# Patient Record
Sex: Male | Born: 1970 | Race: Black or African American | Hispanic: No | Marital: Married | Smoking: Former smoker
Health system: Southern US, Community
[De-identification: ages and names within clinical notes are randomized; demographics above are authoritative.]

## PROBLEM LIST (undated history)

## (undated) DIAGNOSIS — A4902 Methicillin resistant Staphylococcus aureus infection, unspecified site: Secondary | ICD-10-CM

## (undated) DIAGNOSIS — I10 Essential (primary) hypertension: Secondary | ICD-10-CM

## (undated) HISTORY — DX: Morbid (severe) obesity due to excess calories: E66.01

## (undated) HISTORY — DX: Methicillin resistant Staphylococcus aureus infection, unspecified site: A49.02

## (undated) HISTORY — DX: Essential (primary) hypertension: I10

---

## 1998-07-15 ENCOUNTER — Encounter: Payer: Self-pay | Admitting: Emergency Medicine

## 1998-07-15 ENCOUNTER — Emergency Department (HOSPITAL_COMMUNITY): Admission: EM | Admit: 1998-07-15 | Discharge: 1998-07-15 | Payer: Self-pay | Admitting: Emergency Medicine

## 2001-05-14 ENCOUNTER — Encounter: Payer: Self-pay | Admitting: Emergency Medicine

## 2001-05-14 ENCOUNTER — Emergency Department (HOSPITAL_COMMUNITY): Admission: EM | Admit: 2001-05-14 | Discharge: 2001-05-14 | Payer: Self-pay | Admitting: Emergency Medicine

## 2007-10-08 ENCOUNTER — Emergency Department (HOSPITAL_COMMUNITY): Admission: AC | Admit: 2007-10-08 | Discharge: 2007-10-09 | Payer: Self-pay

## 2007-10-11 ENCOUNTER — Emergency Department (HOSPITAL_COMMUNITY): Admission: EM | Admit: 2007-10-11 | Discharge: 2007-10-11 | Payer: Self-pay | Admitting: Emergency Medicine

## 2009-07-17 ENCOUNTER — Emergency Department (HOSPITAL_COMMUNITY): Admission: EM | Admit: 2009-07-17 | Discharge: 2009-07-17 | Payer: Self-pay | Admitting: Emergency Medicine

## 2009-07-20 ENCOUNTER — Ambulatory Visit: Payer: Self-pay | Admitting: Emergency Medicine

## 2009-07-20 ENCOUNTER — Inpatient Hospital Stay (HOSPITAL_COMMUNITY): Admission: EM | Admit: 2009-07-20 | Discharge: 2009-08-06 | Payer: Self-pay | Admitting: Emergency Medicine

## 2009-07-21 ENCOUNTER — Encounter (INDEPENDENT_AMBULATORY_CARE_PROVIDER_SITE_OTHER): Payer: Self-pay | Admitting: Pulmonary Disease

## 2009-07-22 ENCOUNTER — Encounter: Payer: Self-pay | Admitting: Internal Medicine

## 2009-07-22 ENCOUNTER — Encounter (INDEPENDENT_AMBULATORY_CARE_PROVIDER_SITE_OTHER): Payer: Self-pay | Admitting: Internal Medicine

## 2009-07-23 ENCOUNTER — Encounter: Payer: Self-pay | Admitting: Emergency Medicine

## 2009-07-23 ENCOUNTER — Ambulatory Visit: Payer: Self-pay | Admitting: Vascular Surgery

## 2009-07-24 ENCOUNTER — Encounter: Payer: Self-pay | Admitting: Family Medicine

## 2009-08-23 ENCOUNTER — Ambulatory Visit: Payer: Self-pay | Admitting: Vascular Surgery

## 2009-08-23 ENCOUNTER — Ambulatory Visit: Admission: RE | Admit: 2009-08-23 | Discharge: 2009-08-23 | Payer: Self-pay | Admitting: Internal Medicine

## 2009-08-28 ENCOUNTER — Encounter (HOSPITAL_BASED_OUTPATIENT_CLINIC_OR_DEPARTMENT_OTHER): Admission: RE | Admit: 2009-08-28 | Discharge: 2009-10-09 | Payer: Self-pay | Admitting: Internal Medicine

## 2010-03-17 ENCOUNTER — Encounter: Payer: Self-pay | Admitting: Internal Medicine

## 2010-05-13 LAB — BLOOD GAS, ARTERIAL
Acid-base deficit: 7.8 mmol/L — ABNORMAL HIGH (ref 0.0–2.0)
FIO2: 100 %
O2 Saturation: 97.9 %
Patient temperature: 99.2
pO2, Arterial: 129 mmHg — ABNORMAL HIGH (ref 80.0–100.0)

## 2010-05-13 LAB — RENAL FUNCTION PANEL
Albumin: 1.4 g/dL — ABNORMAL LOW (ref 3.5–5.2)
Albumin: 1.6 g/dL — ABNORMAL LOW (ref 3.5–5.2)
Albumin: 1.7 g/dL — ABNORMAL LOW (ref 3.5–5.2)
Albumin: 1.9 g/dL — ABNORMAL LOW (ref 3.5–5.2)
Albumin: 1.6 g/dL — ABNORMAL LOW (ref 3.5–5.2)
BUN: 49 mg/dL — ABNORMAL HIGH (ref 6–23)
BUN: 57 mg/dL — ABNORMAL HIGH (ref 6–23)
BUN: 76 mg/dL — ABNORMAL HIGH (ref 6–23)
BUN: 80 mg/dL — ABNORMAL HIGH (ref 6–23)
BUN: 93 mg/dL — ABNORMAL HIGH (ref 6–23)
BUN: 84 mg/dL — ABNORMAL HIGH (ref 6–23)
CO2: 20 mEq/L (ref 19–32)
CO2: 21 mEq/L (ref 19–32)
CO2: 24 mEq/L (ref 19–32)
CO2: 28 mEq/L (ref 19–32)
CO2: 36 mEq/L — ABNORMAL HIGH (ref 19–32)
CO2: 23 mEq/L (ref 19–32)
CO2: 24 mEq/L (ref 19–32)
Calcium: 7.1 mg/dL — ABNORMAL LOW (ref 8.4–10.5)
Calcium: 8.1 mg/dL — ABNORMAL LOW (ref 8.4–10.5)
Calcium: 8.5 mg/dL (ref 8.4–10.5)
Calcium: 8.6 mg/dL (ref 8.4–10.5)
Calcium: 8.8 mg/dL (ref 8.4–10.5)
Calcium: 9 mg/dL (ref 8.4–10.5)
Calcium: 6.5 mg/dL — ABNORMAL LOW (ref 8.4–10.5)
Chloride: 102 mEq/L (ref 96–112)
Chloride: 103 mEq/L (ref 96–112)
Chloride: 104 mEq/L (ref 96–112)
Chloride: 108 mEq/L (ref 96–112)
Chloride: 117 mEq/L — ABNORMAL HIGH (ref 96–112)
Chloride: 103 mEq/L (ref 96–112)
Chloride: 104 mEq/L (ref 96–112)
Chloride: 104 mEq/L (ref 96–112)
Creatinine, Ser: 4.41 mg/dL — ABNORMAL HIGH (ref 0.4–1.5)
Creatinine, Ser: 4.57 mg/dL — ABNORMAL HIGH (ref 0.4–1.5)
Creatinine, Ser: 4.78 mg/dL — ABNORMAL HIGH (ref 0.4–1.5)
Creatinine, Ser: 5.9 mg/dL — ABNORMAL HIGH (ref 0.4–1.5)
Creatinine, Ser: 6.07 mg/dL — ABNORMAL HIGH (ref 0.4–1.5)
GFR calc Af Amer: 13 mL/min — ABNORMAL LOW (ref >60)
GFR calc Af Amer: 16 mL/min — ABNORMAL LOW (ref >60)
GFR calc Af Amer: 17 mL/min — ABNORMAL LOW (ref >60)
GFR calc Af Amer: 25 mL/min — ABNORMAL LOW (ref >60)
GFR calc Af Amer: 9 mL/min — ABNORMAL LOW (ref >60)
GFR calc non Af Amer: 11 mL/min — ABNORMAL LOW (ref >60)
GFR calc non Af Amer: 11 mL/min — ABNORMAL LOW (ref >60)
GFR calc non Af Amer: 13 mL/min — ABNORMAL LOW (ref >60)
GFR calc non Af Amer: 13 mL/min — ABNORMAL LOW (ref >60)
GFR calc non Af Amer: 14 mL/min — ABNORMAL LOW (ref >60)
GFR calc non Af Amer: 20 mL/min — ABNORMAL LOW (ref >60)
GFR calc non Af Amer: 8 mL/min — ABNORMAL LOW (ref >60)
Glucose, Bld: 103 mg/dL — ABNORMAL HIGH (ref 70–99)
Glucose, Bld: 126 mg/dL — ABNORMAL HIGH (ref 70–99)
Glucose, Bld: 128 mg/dL — ABNORMAL HIGH (ref 70–99)
Glucose, Bld: 147 mg/dL — ABNORMAL HIGH (ref 70–99)
Glucose, Bld: 85 mg/dL (ref 70–99)
Glucose, Bld: 88 mg/dL (ref 70–99)
Glucose, Bld: 91 mg/dL (ref 70–99)
Glucose, Bld: 120 mg/dL — ABNORMAL HIGH (ref 70–99)
Phosphorus: 2.7 mg/dL (ref 2.3–4.6)
Phosphorus: 3.4 mg/dL (ref 2.3–4.6)
Phosphorus: 3.7 mg/dL (ref 2.3–4.6)
Phosphorus: 6.1 mg/dL — ABNORMAL HIGH (ref 2.3–4.6)
Phosphorus: 6.1 mg/dL — ABNORMAL HIGH (ref 2.3–4.6)
Phosphorus: 6.5 mg/dL — ABNORMAL HIGH (ref 2.3–4.6)
Phosphorus: 6.6 mg/dL — ABNORMAL HIGH (ref 2.3–4.6)
Potassium: 3.3 mEq/L — ABNORMAL LOW (ref 3.5–5.1)
Potassium: 4.5 mEq/L (ref 3.5–5.1)
Potassium: 5.1 mEq/L (ref 3.5–5.1)
Potassium: 3.4 mEq/L — ABNORMAL LOW (ref 3.5–5.1)
Potassium: 3.6 mEq/L (ref 3.5–5.1)
Sodium: 137 mEq/L (ref 135–145)
Sodium: 139 mEq/L (ref 135–145)
Sodium: 140 mEq/L (ref 135–145)
Sodium: 141 mEq/L (ref 135–145)
Sodium: 143 mEq/L (ref 135–145)
Sodium: 140 mEq/L (ref 135–145)
Sodium: 141 mEq/L (ref 135–145)

## 2010-05-13 LAB — POCT I-STAT 3, ART BLOOD GAS (G3+)
Acid-Base Excess: 11 mmol/L — ABNORMAL HIGH (ref 0.0–2.0)
Acid-base deficit: 7 mmol/L — ABNORMAL HIGH (ref 0.0–2.0)
Acid-base deficit: 7 mmol/L — ABNORMAL HIGH (ref 0.0–2.0)
Bicarbonate: 17.9 mEq/L — ABNORMAL LOW (ref 20.0–24.0)
Bicarbonate: 21.2 mEq/L (ref 20.0–24.0)
Bicarbonate: 21.8 mEq/L (ref 20.0–24.0)
O2 Saturation: 100 %
O2 Saturation: 100 %
O2 Saturation: 97 %
O2 Saturation: 99 %
Patient temperature: 102.5
Patient temperature: 37
Patient temperature: 98.2
Patient temperature: 99.2
TCO2: 23 mmol/L (ref 0–100)
TCO2: 23 mmol/L (ref 0–100)
TCO2: 23 mmol/L (ref 0–100)
TCO2: 28 mmol/L (ref 0–100)
pCO2 arterial: 45.6 mmHg — ABNORMAL HIGH (ref 35.0–45.0)
pCO2 arterial: 48.5 mmHg — ABNORMAL HIGH (ref 35.0–45.0)
pH, Arterial: 7.215 — ABNORMAL LOW (ref 7.350–7.450)
pH, Arterial: 7.275 — ABNORMAL LOW (ref 7.350–7.450)
pH, Arterial: 7.313 — ABNORMAL LOW (ref 7.350–7.450)
pH, Arterial: 7.34 — ABNORMAL LOW (ref 7.350–7.450)
pO2, Arterial: 160 mmHg — ABNORMAL HIGH (ref 80.0–100.0)
pO2, Arterial: 134 mmHg — ABNORMAL HIGH (ref 80.0–100.0)

## 2010-05-13 LAB — POCT I-STAT EG7
Acid-Base Excess: 12 mmol/L — ABNORMAL HIGH (ref 0.0–2.0)
Acid-Base Excess: 12 mmol/L — ABNORMAL HIGH (ref 0.0–2.0)
Acid-Base Excess: 13 mmol/L — ABNORMAL HIGH (ref 0.0–2.0)
Acid-Base Excess: 13 mmol/L — ABNORMAL HIGH (ref 0.0–2.0)
Acid-Base Excess: 5 mmol/L — ABNORMAL HIGH (ref 0.0–2.0)
Acid-Base Excess: 7 mmol/L — ABNORMAL HIGH (ref 0.0–2.0)
Acid-Base Excess: 8 mmol/L — ABNORMAL HIGH (ref 0.0–2.0)
Bicarbonate: 30.8 mEq/L — ABNORMAL HIGH (ref 20.0–24.0)
Bicarbonate: 31.7 mEq/L — ABNORMAL HIGH (ref 20.0–24.0)
Bicarbonate: 33.5 mEq/L — ABNORMAL HIGH (ref 20.0–24.0)
Bicarbonate: 37.6 mEq/L — ABNORMAL HIGH (ref 20.0–24.0)
Bicarbonate: 38.4 mEq/L — ABNORMAL HIGH (ref 20.0–24.0)
Calcium, Ion: 0.51 mmol/L — CL (ref 1.12–1.32)
Calcium, Ion: 0.58 mmol/L — CL (ref 1.12–1.32)
Calcium, Ion: 0.64 mmol/L — CL (ref 1.12–1.32)
Calcium, Ion: 1.01 mmol/L — ABNORMAL LOW (ref 1.12–1.32)
Calcium, Ion: 1.08 mmol/L — ABNORMAL LOW (ref 1.12–1.32)
Calcium, Ion: 1.1 mmol/L — ABNORMAL LOW (ref 1.12–1.32)
HCT: 31 % — ABNORMAL LOW (ref 39.0–52.0)
HCT: 31 % — ABNORMAL LOW (ref 39.0–52.0)
HCT: 32 % — ABNORMAL LOW (ref 39.0–52.0)
HCT: 33 % — ABNORMAL LOW (ref 39.0–52.0)
HCT: 33 % — ABNORMAL LOW (ref 39.0–52.0)
HCT: 33 % — ABNORMAL LOW (ref 39.0–52.0)
HCT: 33 % — ABNORMAL LOW (ref 39.0–52.0)
Hemoglobin: 10.5 g/dL — ABNORMAL LOW (ref 13.0–17.0)
Hemoglobin: 10.5 g/dL — ABNORMAL LOW (ref 13.0–17.0)
Hemoglobin: 10.5 g/dL — ABNORMAL LOW (ref 13.0–17.0)
Hemoglobin: 10.9 g/dL — ABNORMAL LOW (ref 13.0–17.0)
Hemoglobin: 11.2 g/dL — ABNORMAL LOW (ref 13.0–17.0)
Hemoglobin: 11.2 g/dL — ABNORMAL LOW (ref 13.0–17.0)
Hemoglobin: 9.5 g/dL — ABNORMAL LOW (ref 13.0–17.0)
O2 Saturation: 74 %
O2 Saturation: 74 %
O2 Saturation: 98 %
O2 Saturation: 99 %
Patient temperature: 97
Patient temperature: 98.8
Patient temperature: 98.9
Patient temperature: 99.2
Patient temperature: 99.2
Patient temperature: 99.6
Potassium: 3 mEq/L — ABNORMAL LOW (ref 3.5–5.1)
Potassium: 3.1 mEq/L — ABNORMAL LOW (ref 3.5–5.1)
Potassium: 3.2 mEq/L — ABNORMAL LOW (ref 3.5–5.1)
Potassium: 3.3 mEq/L — ABNORMAL LOW (ref 3.5–5.1)
Potassium: 3.4 mEq/L — ABNORMAL LOW (ref 3.5–5.1)
Potassium: 3.4 mEq/L — ABNORMAL LOW (ref 3.5–5.1)
Potassium: 3.5 mEq/L (ref 3.5–5.1)
Sodium: 140 mEq/L (ref 135–145)
Sodium: 141 mEq/L (ref 135–145)
Sodium: 142 mEq/L (ref 135–145)
Sodium: 142 mEq/L (ref 135–145)
Sodium: 142 mEq/L (ref 135–145)
Sodium: 142 mEq/L (ref 135–145)
TCO2: 32 mmol/L (ref 0–100)
TCO2: 33 mmol/L (ref 0–100)
TCO2: 34 mmol/L (ref 0–100)
TCO2: 35 mmol/L (ref 0–100)
TCO2: 37 mmol/L (ref 0–100)
TCO2: 38 mmol/L (ref 0–100)
TCO2: 40 mmol/L (ref 0–100)
TCO2: 42 mmol/L (ref 0–100)
pCO2, Ven: 42.5 mmHg — ABNORMAL LOW (ref 45.0–50.0)
pCO2, Ven: 43.7 mmHg — ABNORMAL LOW (ref 45.0–50.0)
pCO2, Ven: 45.3 mmHg (ref 45.0–50.0)
pCO2, Ven: 48.4 mmHg (ref 45.0–50.0)
pCO2, Ven: 49.1 mmHg (ref 45.0–50.0)
pCO2, Ven: 57.4 mmHg — ABNORMAL HIGH (ref 45.0–50.0)
pH, Ven: 7.398 — ABNORMAL HIGH (ref 7.250–7.300)
pH, Ven: 7.429 — ABNORMAL HIGH (ref 7.250–7.300)
pH, Ven: 7.45 — ABNORMAL HIGH (ref 7.250–7.300)
pH, Ven: 7.477 — ABNORMAL HIGH (ref 7.250–7.300)
pH, Ven: 7.512 — ABNORMAL HIGH (ref 7.250–7.300)
pH, Ven: 7.534 — ABNORMAL HIGH (ref 7.250–7.300)
pH, Ven: 7.58 — ABNORMAL HIGH (ref 7.250–7.300)
pO2, Ven: 108 mmHg — ABNORMAL HIGH (ref 30.0–45.0)
pO2, Ven: 38 mmHg (ref 30.0–45.0)
pO2, Ven: 39 mmHg (ref 30.0–45.0)
pO2, Ven: 43 mmHg (ref 30.0–45.0)
pO2, Ven: 98 mmHg — ABNORMAL HIGH (ref 30.0–45.0)

## 2010-05-13 LAB — CBC
HCT: 24.1 % — ABNORMAL LOW (ref 39.0–52.0)
HCT: 24.4 % — ABNORMAL LOW (ref 39.0–52.0)
HCT: 27 % — ABNORMAL LOW (ref 39.0–52.0)
HCT: 33.5 % — ABNORMAL LOW (ref 39.0–52.0)
HCT: 31.6 % — ABNORMAL LOW (ref 39.0–52.0)
HCT: 38 % — ABNORMAL LOW (ref 39.0–52.0)
Hemoglobin: 8.4 g/dL — ABNORMAL LOW (ref 13.0–17.0)
Hemoglobin: 8.8 g/dL — ABNORMAL LOW (ref 13.0–17.0)
Hemoglobin: 9.1 g/dL — ABNORMAL LOW (ref 13.0–17.0)
Hemoglobin: 11.5 g/dL — ABNORMAL LOW (ref 13.0–17.0)
Hemoglobin: 12.9 g/dL — ABNORMAL LOW (ref 13.0–17.0)
MCHC: 33.7 g/dL (ref 30.0–36.0)
MCHC: 33.9 g/dL (ref 30.0–36.0)
MCHC: 34.2 g/dL (ref 30.0–36.0)
MCHC: 34.8 g/dL (ref 30.0–36.0)
MCHC: 35.1 g/dL (ref 30.0–36.0)
MCHC: 34.2 g/dL (ref 30.0–36.0)
MCHC: 34.4 g/dL (ref 30.0–36.0)
MCV: 86.3 fL (ref 78.0–100.0)
MCV: 87.2 fL (ref 78.0–100.0)
MCV: 87.4 fL (ref 78.0–100.0)
MCV: 87.4 fL (ref 78.0–100.0)
MCV: 87.7 fL (ref 78.0–100.0)
MCV: 88.2 fL (ref 78.0–100.0)
MCV: 88.2 fL (ref 78.0–100.0)
MCV: 88.1 fL (ref 78.0–100.0)
MCV: 88 fL (ref 78.0–100.0)
Platelets: 116 10*3/uL — ABNORMAL LOW (ref 150–400)
Platelets: 246 10*3/uL (ref 150–400)
Platelets: 390 10*3/uL (ref 150–400)
Platelets: 610 10*3/uL — ABNORMAL HIGH (ref 150–400)
Platelets: 619 10*3/uL — ABNORMAL HIGH (ref 150–400)
Platelets: 95 10*3/uL — ABNORMAL LOW (ref 150–400)
Platelets: 153 10*3/uL (ref 150–400)
Platelets: 185 10*3/uL (ref 150–400)
RBC: 2.86 MIL/uL — ABNORMAL LOW (ref 4.22–5.81)
RBC: 2.96 MIL/uL — ABNORMAL LOW (ref 4.22–5.81)
RBC: 3.04 MIL/uL — ABNORMAL LOW (ref 4.22–5.81)
RBC: 3.06 MIL/uL — ABNORMAL LOW (ref 4.22–5.81)
RBC: 3.19 MIL/uL — ABNORMAL LOW (ref 4.22–5.81)
RBC: 3.81 MIL/uL — ABNORMAL LOW (ref 4.22–5.81)
RBC: 3.6 MIL/uL — ABNORMAL LOW (ref 4.22–5.81)
RBC: 3.67 MIL/uL — ABNORMAL LOW (ref 4.22–5.81)
RBC: 4.24 MIL/uL (ref 4.22–5.81)
RDW: 15.4 % (ref 11.5–15.5)
RDW: 15.5 % (ref 11.5–15.5)
RDW: 15.7 % — ABNORMAL HIGH (ref 11.5–15.5)
RDW: 16 % — ABNORMAL HIGH (ref 11.5–15.5)
RDW: 14.8 % (ref 11.5–15.5)
RDW: 15.1 % (ref 11.5–15.5)
RDW: 15.1 % (ref 11.5–15.5)
RDW: 16.3 % — ABNORMAL HIGH (ref 11.5–15.5)
WBC: 11.3 10*3/uL — ABNORMAL HIGH (ref 4.0–10.5)
WBC: 12.5 10*3/uL — ABNORMAL HIGH (ref 4.0–10.5)
WBC: 16 10*3/uL — ABNORMAL HIGH (ref 4.0–10.5)
WBC: 18.2 10*3/uL — ABNORMAL HIGH (ref 4.0–10.5)
WBC: 20.5 10*3/uL — ABNORMAL HIGH (ref 4.0–10.5)
WBC: 21.8 10*3/uL — ABNORMAL HIGH (ref 4.0–10.5)
WBC: 33 10*3/uL — ABNORMAL HIGH (ref 4.0–10.5)
WBC: 14.3 10*3/uL — ABNORMAL HIGH (ref 4.0–10.5)
WBC: 15.1 10*3/uL — ABNORMAL HIGH (ref 4.0–10.5)
WBC: 17 10*3/uL — ABNORMAL HIGH (ref 4.0–10.5)
WBC: 19.2 10*3/uL — ABNORMAL HIGH (ref 4.0–10.5)

## 2010-05-13 LAB — PROTIME-INR
INR: 1.39 (ref 0.00–1.49)
INR: 1.43 (ref 0.00–1.49)
Prothrombin Time: 16.9 seconds — ABNORMAL HIGH (ref 11.6–15.2)
Prothrombin Time: 16.5 seconds — ABNORMAL HIGH (ref 11.6–15.2)

## 2010-05-13 LAB — URINALYSIS, ROUTINE W REFLEX MICROSCOPIC
Bilirubin Urine: NEGATIVE
Glucose, UA: NEGATIVE mg/dL
Glucose, UA: NEGATIVE mg/dL
Hgb urine dipstick: NEGATIVE
Ketones, ur: NEGATIVE mg/dL
Ketones, ur: NEGATIVE mg/dL
Protein, ur: 100 mg/dL — AB
Protein, ur: NEGATIVE mg/dL
Specific Gravity, Urine: 1.009 (ref 1.005–1.030)
Specific Gravity, Urine: 1.025 (ref 1.005–1.030)
Urobilinogen, UA: 0.2 mg/dL (ref 0.0–1.0)
Urobilinogen, UA: 1 mg/dL (ref 0.0–1.0)
pH: 7.5 (ref 5.0–8.0)

## 2010-05-13 LAB — BASIC METABOLIC PANEL
BUN: 93 mg/dL — ABNORMAL HIGH (ref 6–23)
BUN: 99 mg/dL — ABNORMAL HIGH (ref 6–23)
BUN: 39 mg/dL — ABNORMAL HIGH (ref 6–23)
BUN: 57 mg/dL — ABNORMAL HIGH (ref 6–23)
CO2: 20 mEq/L (ref 19–32)
CO2: 30 mEq/L (ref 19–32)
CO2: 19 mEq/L (ref 19–32)
Calcium: 8.5 mg/dL (ref 8.4–10.5)
Calcium: 8.6 mg/dL (ref 8.4–10.5)
Calcium: 6.2 mg/dL — CL (ref 8.4–10.5)
Calcium: 6.3 mg/dL — CL (ref 8.4–10.5)
Chloride: 114 mEq/L — ABNORMAL HIGH (ref 96–112)
Chloride: 116 mEq/L — ABNORMAL HIGH (ref 96–112)
Chloride: 103 mEq/L (ref 96–112)
Creatinine, Ser: 2.98 mg/dL — ABNORMAL HIGH (ref 0.4–1.5)
Creatinine, Ser: 5.27 mg/dL — ABNORMAL HIGH (ref 0.4–1.5)
Creatinine, Ser: 5.48 mg/dL — ABNORMAL HIGH (ref 0.4–1.5)
Creatinine, Ser: 4.56 mg/dL — ABNORMAL HIGH (ref 0.4–1.5)
Creatinine, Ser: 5.96 mg/dL — ABNORMAL HIGH (ref 0.4–1.5)
Creatinine, Ser: 7.81 mg/dL — ABNORMAL HIGH (ref 0.4–1.5)
GFR calc Af Amer: 26 mL/min — ABNORMAL LOW (ref >60)
GFR calc Af Amer: 29 mL/min — ABNORMAL LOW (ref >60)
GFR calc Af Amer: 15 mL/min — ABNORMAL LOW (ref >60)
GFR calc Af Amer: 13 mL/min — ABNORMAL LOW (ref >60)
GFR calc Af Amer: 9 mL/min — ABNORMAL LOW (ref >60)
GFR calc non Af Amer: 12 mL/min — ABNORMAL LOW (ref >60)
GFR calc non Af Amer: 12 mL/min — ABNORMAL LOW (ref >60)
GFR calc non Af Amer: 15 mL/min — ABNORMAL LOW (ref >60)
Glucose, Bld: 114 mg/dL — ABNORMAL HIGH (ref 70–99)
Glucose, Bld: 127 mg/dL — ABNORMAL HIGH (ref 70–99)
Glucose, Bld: 95 mg/dL (ref 70–99)
Potassium: 3.3 mEq/L — ABNORMAL LOW (ref 3.5–5.1)
Potassium: 3.8 mEq/L (ref 3.5–5.1)
Potassium: 4.9 mEq/L (ref 3.5–5.1)
Potassium: 2.7 mEq/L — CL (ref 3.5–5.1)
Potassium: 3.1 mEq/L — ABNORMAL LOW (ref 3.5–5.1)
Sodium: 138 mEq/L (ref 135–145)
Sodium: 140 mEq/L (ref 135–145)
Sodium: 142 mEq/L (ref 135–145)
Sodium: 133 mEq/L — ABNORMAL LOW (ref 135–145)

## 2010-05-13 LAB — GLUCOSE, CAPILLARY
Glucose-Capillary: 103 mg/dL — ABNORMAL HIGH (ref 70–99)
Glucose-Capillary: 108 mg/dL — ABNORMAL HIGH (ref 70–99)
Glucose-Capillary: 110 mg/dL — ABNORMAL HIGH (ref 70–99)
Glucose-Capillary: 111 mg/dL — ABNORMAL HIGH (ref 70–99)
Glucose-Capillary: 113 mg/dL — ABNORMAL HIGH (ref 70–99)
Glucose-Capillary: 116 mg/dL — ABNORMAL HIGH (ref 70–99)
Glucose-Capillary: 118 mg/dL — ABNORMAL HIGH (ref 70–99)
Glucose-Capillary: 122 mg/dL — ABNORMAL HIGH (ref 70–99)
Glucose-Capillary: 123 mg/dL — ABNORMAL HIGH (ref 70–99)
Glucose-Capillary: 125 mg/dL — ABNORMAL HIGH (ref 70–99)
Glucose-Capillary: 126 mg/dL — ABNORMAL HIGH (ref 70–99)
Glucose-Capillary: 127 mg/dL — ABNORMAL HIGH (ref 70–99)
Glucose-Capillary: 128 mg/dL — ABNORMAL HIGH (ref 70–99)
Glucose-Capillary: 131 mg/dL — ABNORMAL HIGH (ref 70–99)
Glucose-Capillary: 132 mg/dL — ABNORMAL HIGH (ref 70–99)
Glucose-Capillary: 144 mg/dL — ABNORMAL HIGH (ref 70–99)
Glucose-Capillary: 149 mg/dL — ABNORMAL HIGH (ref 70–99)
Glucose-Capillary: 83 mg/dL (ref 70–99)
Glucose-Capillary: 84 mg/dL (ref 70–99)
Glucose-Capillary: 89 mg/dL (ref 70–99)
Glucose-Capillary: 90 mg/dL (ref 70–99)
Glucose-Capillary: 91 mg/dL (ref 70–99)
Glucose-Capillary: 95 mg/dL (ref 70–99)
Glucose-Capillary: 99 mg/dL (ref 70–99)
Glucose-Capillary: 114 mg/dL — ABNORMAL HIGH (ref 70–99)
Glucose-Capillary: 102 mg/dL — ABNORMAL HIGH (ref 70–99)
Glucose-Capillary: 106 mg/dL — ABNORMAL HIGH (ref 70–99)
Glucose-Capillary: 106 mg/dL — ABNORMAL HIGH (ref 70–99)
Glucose-Capillary: 125 mg/dL — ABNORMAL HIGH (ref 70–99)
Glucose-Capillary: 128 mg/dL — ABNORMAL HIGH (ref 70–99)
Glucose-Capillary: 128 mg/dL — ABNORMAL HIGH (ref 70–99)
Glucose-Capillary: 140 mg/dL — ABNORMAL HIGH (ref 70–99)
Glucose-Capillary: 93 mg/dL (ref 70–99)
Glucose-Capillary: 99 mg/dL (ref 70–99)

## 2010-05-13 LAB — FECAL LACTOFERRIN, QUANT: Fecal Lactoferrin: NEGATIVE

## 2010-05-13 LAB — COMPREHENSIVE METABOLIC PANEL
ALT: 30 U/L (ref 0–53)
ALT: 54 U/L — ABNORMAL HIGH (ref 0–53)
ALT: 128 U/L — ABNORMAL HIGH (ref 0–53)
ALT: 131 U/L — ABNORMAL HIGH (ref 0–53)
ALT: 21 U/L (ref 0–53)
AST: 27 U/L (ref 0–37)
AST: 64 U/L — ABNORMAL HIGH (ref 0–37)
AST: 178 U/L — ABNORMAL HIGH (ref 0–37)
Albumin: 1.6 g/dL — ABNORMAL LOW (ref 3.5–5.2)
Albumin: 2 g/dL — ABNORMAL LOW (ref 3.5–5.2)
Albumin: 2.2 g/dL — ABNORMAL LOW (ref 3.5–5.2)
Alkaline Phosphatase: 80 U/L (ref 39–117)
Alkaline Phosphatase: 99 U/L (ref 39–117)
Alkaline Phosphatase: 71 U/L (ref 39–117)
BUN: 6 mg/dL (ref 6–23)
BUN: 68 mg/dL — ABNORMAL HIGH (ref 6–23)
CO2: 20 mEq/L (ref 19–32)
CO2: 27 mEq/L (ref 19–32)
Calcium: 8.4 mg/dL (ref 8.4–10.5)
Calcium: 6.3 mg/dL — CL (ref 8.4–10.5)
Chloride: 109 mEq/L (ref 96–112)
Chloride: 99 mEq/L (ref 96–112)
Chloride: 105 mEq/L (ref 96–112)
Creatinine, Ser: 6.06 mg/dL — ABNORMAL HIGH (ref 0.4–1.5)
Creatinine, Ser: 6.91 mg/dL — ABNORMAL HIGH (ref 0.4–1.5)
GFR calc Af Amer: 13 mL/min — ABNORMAL LOW (ref >60)
GFR calc Af Amer: 19 mL/min — ABNORMAL LOW (ref >60)
GFR calc non Af Amer: 9 mL/min — ABNORMAL LOW (ref >60)
GFR calc non Af Amer: >60 mL/min (ref >60)
Glucose, Bld: 82 mg/dL (ref 70–99)
Glucose, Bld: 100 mg/dL — ABNORMAL HIGH (ref 70–99)
Glucose, Bld: 85 mg/dL (ref 70–99)
Potassium: 4.2 mEq/L (ref 3.5–5.1)
Potassium: 2.5 mEq/L — CL (ref 3.5–5.1)
Potassium: 2.6 mEq/L — CL (ref 3.5–5.1)
Potassium: 3.4 mEq/L — ABNORMAL LOW (ref 3.5–5.1)
Sodium: 139 mEq/L (ref 135–145)
Sodium: 144 mEq/L (ref 135–145)
Sodium: 131 mEq/L — ABNORMAL LOW (ref 135–145)
Sodium: 132 mEq/L — ABNORMAL LOW (ref 135–145)
Total Bilirubin: 0.5 mg/dL (ref 0.3–1.2)
Total Bilirubin: 1.4 mg/dL — ABNORMAL HIGH (ref 0.3–1.2)
Total Bilirubin: 0.8 mg/dL (ref 0.3–1.2)
Total Bilirubin: 0.9 mg/dL (ref 0.3–1.2)
Total Protein: 5.8 g/dL — ABNORMAL LOW (ref 6.0–8.3)
Total Protein: 5.8 g/dL — ABNORMAL LOW (ref 6.0–8.3)
Total Protein: 5.9 g/dL — ABNORMAL LOW (ref 6.0–8.3)
Total Protein: 6.8 g/dL (ref 6.0–8.3)

## 2010-05-13 LAB — URINE MICROSCOPIC-ADD ON

## 2010-05-13 LAB — POCT I-STAT 7, (LYTES, BLD GAS, ICA,H+H)
Acid-Base Excess: 21 mmol/L — ABNORMAL HIGH (ref 0.0–2.0)
Acid-Base Excess: 5 mmol/L — ABNORMAL HIGH (ref 0.0–2.0)
Acid-Base Excess: 7 mmol/L — ABNORMAL HIGH (ref 0.0–2.0)
Bicarbonate: 43.9 mEq/L — ABNORMAL HIGH (ref 20.0–24.0)
HCT: 31 % — ABNORMAL LOW (ref 39.0–52.0)
Hemoglobin: 10.5 g/dL — ABNORMAL LOW (ref 13.0–17.0)
Hemoglobin: 10.5 g/dL — ABNORMAL LOW (ref 13.0–17.0)
O2 Saturation: 98 %
O2 Saturation: 98 %
O2 Saturation: 99 %
Patient temperature: 97
Patient temperature: 98.8
Potassium: 3.3 mEq/L — ABNORMAL LOW (ref 3.5–5.1)
Potassium: 3.4 mEq/L — ABNORMAL LOW (ref 3.5–5.1)
Potassium: 3.6 mEq/L (ref 3.5–5.1)
TCO2: 30 mmol/L (ref 0–100)
TCO2: 34 mmol/L (ref 0–100)
TCO2: 45 mmol/L (ref 0–100)
pCO2 arterial: 39.9 mmHg (ref 35.0–45.0)
pCO2 arterial: 40 mmHg (ref 35.0–45.0)
pCO2 arterial: 40 mmHg (ref 35.0–45.0)
pH, Arterial: 7.48 — ABNORMAL HIGH (ref 7.350–7.450)
pO2, Arterial: 128 mmHg — ABNORMAL HIGH (ref 80.0–100.0)
pO2, Arterial: 132 mmHg — ABNORMAL HIGH (ref 80.0–100.0)
pO2, Arterial: 87 mmHg (ref 80.0–100.0)
pO2, Arterial: 97 mmHg (ref 80.0–100.0)

## 2010-05-13 LAB — CARDIAC PANEL(CRET KIN+CKTOT+MB+TROPI)
CK, MB: 2.8 ng/mL (ref 0.3–4.0)
CK, MB: 3.1 ng/mL (ref 0.3–4.0)
Relative Index: INVALID (ref 0.0–2.5)
Relative Index: INVALID (ref 0.0–2.5)
Relative Index: 0.6 (ref 0.0–2.5)
Total CK: 28 U/L (ref 7–232)
Total CK: 30 U/L (ref 7–232)
Troponin I: 0.01 ng/mL (ref 0.00–0.06)
Troponin I: 0.02 ng/mL (ref 0.00–0.06)
Troponin I: <0.01 ng/mL (ref 0.00–0.06)
Troponin I: 0.13 ng/mL — ABNORMAL HIGH (ref 0.00–0.06)

## 2010-05-13 LAB — CULTURE, BLOOD (ROUTINE X 2)
Culture: NO GROWTH
Culture: NO GROWTH
Culture: NO GROWTH

## 2010-05-13 LAB — CK
Total CK: 1624 U/L — ABNORMAL HIGH (ref 7–232)
Total CK: 2998 U/L — ABNORMAL HIGH (ref 7–232)

## 2010-05-13 LAB — ALT: ALT: 42 U/L (ref 0–53)

## 2010-05-13 LAB — CLOSTRIDIUM DIFFICILE EIA: C difficile Toxins A+B, EIA: NEGATIVE

## 2010-05-13 LAB — RAPID URINE DRUG SCREEN, HOSP PERFORMED
Barbiturates: NOT DETECTED
Benzodiazepines: NOT DETECTED
Cocaine: NOT DETECTED
Opiates: NOT DETECTED

## 2010-05-13 LAB — HEPARIN INDUCED THROMBOCYTOPENIA PNL
Heparin Induced Plt Ab: NEGATIVE
Patient O.D.: 0.245
UFH SRA Result: NEGATIVE

## 2010-05-13 LAB — OVA AND PARASITE EXAMINATION: Ova and parasites: NONE SEEN

## 2010-05-13 LAB — URINE CULTURE

## 2010-05-13 LAB — CARBOXYHEMOGLOBIN
Methemoglobin: 1.1 % (ref 0.0–1.5)
Total hemoglobin: 11.5 g/dL — ABNORMAL LOW (ref 13.5–18.0)

## 2010-05-13 LAB — LACTIC ACID, PLASMA
Lactic Acid, Venous: 5.1 mmol/L — ABNORMAL HIGH (ref 0.5–2.2)
Lactic Acid, Venous: 5.1 mmol/L — ABNORMAL HIGH (ref 0.5–2.2)

## 2010-05-13 LAB — HEPATITIS PANEL, ACUTE
Hep A IgM: NEGATIVE
Hep B C IgM: NEGATIVE

## 2010-05-13 LAB — VITAMIN B12: Vitamin B-12: 1454 pg/mL — ABNORMAL HIGH (ref 211–911)

## 2010-05-13 LAB — VANCOMYCIN, RANDOM
Vancomycin Rm: 16.3 ug/mL
Vancomycin Rm: 18.7 ug/mL

## 2010-05-13 LAB — FERRITIN
Ferritin: 564 ng/mL — ABNORMAL HIGH (ref 22–322)
Ferritin: 646 ng/mL — ABNORMAL HIGH (ref 22–322)

## 2010-05-13 LAB — HEPATITIS B SURFACE ANTIBODY,QUALITATIVE: Hep B S Ab: NEGATIVE

## 2010-05-13 LAB — IRON AND TIBC
Iron: 30 ug/dL — ABNORMAL LOW (ref 42–135)
Saturation Ratios: 11 % — ABNORMAL LOW (ref 20–55)
UIBC: 237 ug/dL
UIBC: 247 ug/dL

## 2010-05-13 LAB — DIFFERENTIAL
Basophils Absolute: 0 10*3/uL (ref 0.0–0.1)
Basophils Absolute: 0 10*3/uL (ref 0.0–0.1)
Basophils Absolute: 0 10*3/uL (ref 0.0–0.1)
Basophils Relative: 0 % (ref 0–1)
Basophils Relative: 0 % (ref 0–1)
Eosinophils Absolute: 0.4 10*3/uL (ref 0.0–0.7)
Eosinophils Absolute: 1.2 10*3/uL — ABNORMAL HIGH (ref 0.0–0.7)
Eosinophils Relative: 1 % (ref 0–5)
Eosinophils Relative: 1 % (ref 0–5)
Lymphocytes Relative: 0 % — ABNORMAL LOW (ref 12–46)
Lymphs Abs: 1.5 10*3/uL (ref 0.7–4.0)
Lymphs Abs: 0 10*3/uL — ABNORMAL LOW (ref 0.7–4.0)
Lymphs Abs: 0.1 10*3/uL — ABNORMAL LOW (ref 0.7–4.0)
Monocytes Absolute: 0.1 10*3/uL (ref 0.1–1.0)
Monocytes Relative: 1 % — ABNORMAL LOW (ref 3–12)
Neutro Abs: 7.7 10*3/uL (ref 1.7–7.7)
Neutrophils Relative %: 84 % — ABNORMAL HIGH (ref 43–77)
Neutrophils Relative %: 73 % (ref 43–77)
WBC Morphology: INCREASED

## 2010-05-13 LAB — MRSA PCR SCREENING
MRSA by PCR: POSITIVE — AB
MRSA by PCR: POSITIVE — AB

## 2010-05-13 LAB — CULTURE, BAL-QUANTITATIVE W GRAM STAIN
Colony Count: NO GROWTH
Culture: NO GROWTH
Culture: NO GROWTH

## 2010-05-13 LAB — RETICULOCYTES
RBC.: 3.18 MIL/uL — ABNORMAL LOW (ref 4.22–5.81)
Retic Count, Absolute: 19.1 10*3/uL (ref 19.0–186.0)
Retic Ct Pct: 0.8 % (ref 0.4–3.1)

## 2010-05-13 LAB — TYPE AND SCREEN
ABO/RH(D): O POS
Antibody Screen: NEGATIVE

## 2010-05-13 LAB — FOLATE: Folate: 8.4 ng/mL

## 2010-05-13 LAB — D-DIMER, QUANTITATIVE: D-Dimer, Quant: 3.01 ug/mL-FEU — ABNORMAL HIGH (ref 0.00–0.48)

## 2010-05-13 LAB — MAGNESIUM
Magnesium: 1.7 mg/dL (ref 1.5–2.5)
Magnesium: 1.8 mg/dL (ref 1.5–2.5)
Magnesium: 2.1 mg/dL (ref 1.5–2.5)
Magnesium: 2.9 mg/dL — ABNORMAL HIGH (ref 1.5–2.5)
Magnesium: 1.8 mg/dL (ref 1.5–2.5)

## 2010-05-13 LAB — ANA: Anti Nuclear Antibody(ANA): NEGATIVE

## 2010-05-13 LAB — HEPATIC FUNCTION PANEL
ALT: 109 U/L — ABNORMAL HIGH (ref 0–53)
AST: 49 U/L — ABNORMAL HIGH (ref 0–37)
AST: 63 U/L — ABNORMAL HIGH (ref 0–37)
Indirect Bilirubin: 0.4 mg/dL (ref 0.3–0.9)
Indirect Bilirubin: 0.6 mg/dL (ref 0.3–0.9)
Total Protein: 6 g/dL (ref 6.0–8.3)
Total Protein: 5.9 g/dL — ABNORMAL LOW (ref 6.0–8.3)

## 2010-05-13 LAB — APTT: aPTT: 40 seconds — ABNORMAL HIGH (ref 24–37)

## 2010-05-13 LAB — AMYLASE: Amylase: 58 U/L (ref 0–105)

## 2010-05-13 LAB — STOOL CULTURE

## 2010-05-13 LAB — DIC (DISSEMINATED INTRAVASCULAR COAGULATION)PANEL: Smear Review: NONE SEEN

## 2010-05-13 LAB — PROCALCITONIN
Procalcitonin: 92.01 ng/mL
Procalcitonin: >200 ng/mL

## 2010-05-13 LAB — C3 COMPLEMENT: C3 Complement: 128 mg/dL (ref 88–201)

## 2010-05-13 LAB — AST: AST: 45 U/L — ABNORMAL HIGH (ref 0–37)

## 2010-05-13 LAB — PHOSPHORUS: Phosphorus: 7.2 mg/dL — ABNORMAL HIGH (ref 2.3–4.6)

## 2010-05-13 LAB — HEMOCCULT GUIAC POC 1CARD (OFFICE): Fecal Occult Bld: NEGATIVE

## 2010-05-13 LAB — CORTISOL: Cortisol, Plasma: 51.6 ug/dL

## 2010-05-13 LAB — CREATININE, URINE, RANDOM: Creatinine, Urine: 370.8 mg/dL

## 2010-11-22 LAB — CBC
HCT: 42.2
MCHC: 33.5
MCV: 89.9
RBC: 4.69
WBC: 9.8

## 2010-11-22 LAB — POCT I-STAT, CHEM 8
BUN: 12
Chloride: 104
Creatinine, Ser: 1.2
Potassium: 3.1 — ABNORMAL LOW
Sodium: 141

## 2010-11-22 LAB — URINALYSIS, ROUTINE W REFLEX MICROSCOPIC
Glucose, UA: NEGATIVE
Hgb urine dipstick: NEGATIVE
Protein, ur: NEGATIVE
Specific Gravity, Urine: 1.011
pH: 7

## 2010-11-22 LAB — PROTIME-INR: Prothrombin Time: 11.9

## 2010-11-22 LAB — RAPID URINE DRUG SCREEN, HOSP PERFORMED
Amphetamines: NOT DETECTED
Cocaine: NOT DETECTED
Opiates: NOT DETECTED
Tetrahydrocannabinol: NOT DETECTED

## 2010-11-22 LAB — URINE MICROSCOPIC-ADD ON

## 2010-11-22 LAB — SAMPLE TO BLOOD BANK

## 2017-03-29 ENCOUNTER — Other Ambulatory Visit: Payer: Self-pay

## 2017-03-29 ENCOUNTER — Emergency Department (HOSPITAL_COMMUNITY): Payer: 59

## 2017-03-29 ENCOUNTER — Encounter (HOSPITAL_COMMUNITY): Payer: Self-pay | Admitting: *Deleted

## 2017-03-29 ENCOUNTER — Emergency Department (HOSPITAL_COMMUNITY)
Admission: EM | Admit: 2017-03-29 | Discharge: 2017-03-29 | Disposition: A | Discharge status: Home / Self Care | Payer: 59 | Attending: Emergency Medicine | Admitting: Emergency Medicine

## 2017-03-29 DIAGNOSIS — M79671 Pain in right foot: Secondary | ICD-10-CM | POA: Insufficient documentation

## 2017-03-29 MED ORDER — DICLOFENAC SODIUM 75 MG PO TBEC: 75.0000 mg | DELAYED_RELEASE_TABLET | Freq: Two times a day (BID) | ORAL | 0 refills | Status: DC

## 2017-03-29 NOTE — ED Provider Notes (Signed)
[facility] DEPT Provider Note   CSN: [id number] Arrival date & time: 03/29/17  0713     History   Chief Complaint Chief Complaint  Patient presents with  . Foot Pain    Rt    HPI Mike [name] is a 47 y.o. male.  The history is provided by the patient. No language interpreter was used.  Foot Pain  This is a new problem. The problem occurs constantly. Nothing aggravates the symptoms. Nothing relieves the symptoms. He has tried nothing for the symptoms. The treatment provided no relief.  Pt reports he has pain in the top of his foot and bottom of his foot near his heel. Pt reports pain with putting weight on his foot.   History reviewed. No pertinent past medical history.  There are no active problems to display for this patient.   History reviewed. No pertinent surgical history.     Home Medications    Prior to Admission medications   Not on File    Family History No family history on file.  Social History Social History   Tobacco Use  . Smoking status: Never Smoker  . Smokeless tobacco: Never Used  Substance Use Topics  . Alcohol use: No    Frequency: Never  . Drug use: No     Allergies   Patient has no known allergies.   Review of Systems Review of Systems  All other systems reviewed and are negative.    Physical Exam Updated Vital Signs BP (!) 197/120 (BP Location: Left Arm) Comment: do not take meds  Pulse (!) 105   Temp 98.1 F (36.7 C) (Oral)   Resp 18   SpO2 95%   Physical Exam  Constitutional: He appears well-developed and well-nourished.  HENT:  Head: Normocephalic.  Musculoskeletal: He exhibits tenderness.  Tender left foot,  Pain with movement,  No erythema   Neurological: He is alert.  Skin: Skin is warm.  Psychiatric: He has a normal mood and affect.  Nursing note and vitals reviewed.    ED Treatments / Results  Labs (all labs ordered are listed, but only abnormal results are  displayed) Labs Reviewed - No data to display  EKG  EKG Interpretation None       Radiology Dg Foot Complete Right  Result Date: 03/29/2017 CLINICAL DATA:  Midfoot plantar pain. EXAM: RIGHT FOOT COMPLETE - 3+ VIEW COMPARISON:  None. FINDINGS: There is no evidence of fracture or dislocation. There is no evidence of arthropathy or other focal bone abnormality. Soft tissues are unremarkable. IMPRESSION: Negative. Electronically Signed   By: Fidela Salisbury M.D.   On: 03/29/2017 08:34    Procedures Procedures (including critical care time)  Medications Ordered in ED Medications - No data to display   Initial Impression / Assessment and Plan / ED Course  I have reviewed the triage vital signs and the nursing notes.  Pertinent labs & imaging results that were available during my care of the patient were reviewed by me and considered in my medical decision making (see chart for details).     MDM  Xray normal,  Pt having difficulty walking.   Pain is not in joint.  I doubt gout,  No fracture on xray.  He may have plantar fascitis,   He is suppose to be at work.   Post op shoe Ace wrap OOW note Ibuprofen Pt advised to follow up with Orthopaedist if pain persist Final Clinical Impressions(s) / ED Diagnoses  Final diagnoses:  Right foot pain    ED Discharge Orders        Ordered    diclofenac (VOLTAREN) 75 MG EC tablet  2 times daily     03/29/17 8335    An After Visit Summary was printed and given to the patient.   Fransico Meadow, [place] 03/29/17 8251    Fatima Blank, MD 03/30/17 [phone]

## 2017-03-29 NOTE — ED Triage Notes (Signed)
Pt states he has pain in his rt foot post arch and top of foot, no injury noted, no pain when weight is removed but painful to apply weight.

## 2017-03-29 NOTE — ED Notes (Signed)
Pt is still in pain when attempting to walk. I have notified Statistician and informed him of pt status and would like to talk to [facility].

## 2017-03-29 NOTE — Discharge Instructions (Signed)
Return if any problems.

## 2017-05-24 ENCOUNTER — Emergency Department (HOSPITAL_COMMUNITY): Payer: 59

## 2017-05-24 ENCOUNTER — Encounter (HOSPITAL_COMMUNITY): Payer: Self-pay | Admitting: Emergency Medicine

## 2017-05-24 ENCOUNTER — Emergency Department (HOSPITAL_COMMUNITY)
Admission: EM | Admit: 2017-05-24 | Discharge: 2017-05-24 | Disposition: A | Discharge status: Home / Self Care | Payer: 59 | Attending: Emergency Medicine | Admitting: Emergency Medicine

## 2017-05-24 DIAGNOSIS — I10 Essential (primary) hypertension: Secondary | ICD-10-CM

## 2017-05-24 DIAGNOSIS — M79672 Pain in left foot: Secondary | ICD-10-CM | POA: Insufficient documentation

## 2017-05-24 DIAGNOSIS — Z79899 Other long term (current) drug therapy: Secondary | ICD-10-CM | POA: Insufficient documentation

## 2017-05-24 LAB — CBC WITH DIFFERENTIAL/PLATELET
Basophils Absolute: 0 10*3/uL (ref 0.0–0.1)
Basophils Relative: 0 %
Eosinophils Absolute: 0.1 10*3/uL (ref 0.0–0.7)
Eosinophils Relative: 1 %
HCT: 42.6 % (ref 39.0–52.0)
Hemoglobin: 14.4 g/dL (ref 13.0–17.0)
Lymphocytes Relative: 23 %
Lymphs Abs: 1.7 10*3/uL (ref 0.7–4.0)
MCH: 29.9 pg (ref 26.0–34.0)
MCHC: 33.8 g/dL (ref 30.0–36.0)
MCV: 88.4 fL (ref 78.0–100.0)
Monocytes Absolute: 0.5 10*3/uL (ref 0.1–1.0)
Monocytes Relative: 7 %
Neutro Abs: 5.1 10*3/uL (ref 1.7–7.7)
Neutrophils Relative %: 69 %
Platelets: 205 10*3/uL (ref 150–400)
RBC: 4.82 MIL/uL (ref 4.22–5.81)
RDW: 13.3 % (ref 11.5–15.5)
WBC: 7.4 10*3/uL (ref 4.0–10.5)

## 2017-05-24 LAB — COMPREHENSIVE METABOLIC PANEL
ALT: 24 U/L (ref 17–63)
AST: 22 U/L (ref 15–41)
Albumin: 3.6 g/dL (ref 3.5–5.0)
Alkaline Phosphatase: 61 U/L (ref 38–126)
Anion gap: 10 (ref 5–15)
BUN: 16 mg/dL (ref 6–20)
CO2: 24 mmol/L (ref 22–32)
Calcium: 9.1 mg/dL (ref 8.9–10.3)
Chloride: 106 mmol/L (ref 101–111)
Creatinine, Ser: 1.1 mg/dL (ref 0.61–1.24)
GFR calc Af Amer: >60 mL/min (ref >60)
GFR calc non Af Amer: >60 mL/min (ref >60)
Glucose, Bld: 134 mg/dL — ABNORMAL HIGH (ref 65–99)
Potassium: 3.6 mmol/L (ref 3.5–5.1)
Sodium: 140 mmol/L (ref 135–145)
Total Bilirubin: 0.5 mg/dL (ref 0.3–1.2)
Total Protein: 7.6 g/dL (ref 6.5–8.1)

## 2017-05-24 LAB — URINALYSIS, ROUTINE W REFLEX MICROSCOPIC
Bacteria, UA: NONE SEEN
Bilirubin Urine: NEGATIVE
Glucose, UA: NEGATIVE mg/dL
Hgb urine dipstick: NEGATIVE
Ketones, ur: NEGATIVE mg/dL
Nitrite: NEGATIVE
Protein, ur: NEGATIVE mg/dL
Specific Gravity, Urine: 1.023 (ref 1.005–1.030)
Squamous Epithelial / LPF: NONE SEEN
pH: 6 (ref 5.0–8.0)

## 2017-05-24 MED ORDER — SODIUM CHLORIDE 0.9 % IV BOLUS
1000.0000 mL | Freq: Once | INTRAVENOUS | Status: AC
  Administered 2017-05-24: 1000 mL via INTRAVENOUS

## 2017-05-24 MED ORDER — LISINOPRIL-HYDROCHLOROTHIAZIDE 10-12.5 MG PO TABS: 1.0000 | ORAL_TABLET | Freq: Every day | ORAL | 0 refills | Status: DC

## 2017-05-24 NOTE — ED Provider Notes (Signed)
[facility] DEPT Provider Note   CSN: [id number] Arrival date & time: 05/24/17  1009     History   Chief Complaint Chief Complaint  Patient presents with  . Foot Pain    HPI Mike [name] is a 47 y.o. male with history of hypertension presents today for evaluation of acute onset, progressively worsening left foot pain for 4 days.  He states that on Thursday after work he noted pain to the dorsum of his left foot.  At rest he has no pain but when attempting to bear weight or dorsiflex the foot he has severe pain to the lateral dorsum of the left foot.  He notes no known trauma or falls but states "I think my work boots are a little too tight ".  He denies numbness, tingling, or weakness.  He notes mild swelling to the dorsum of the foot.  He is attempted to use crutches without relief of his symptoms.  He had similar pain last month in his right foot which resolved.  He is a former smoker but quit decades ago.  He denies chest pain, shortness of breath, abdominal pain, nausea, vomiting, fevers, or chills.  He notes that he is aware that he has a history of hypertension but does not take any medications for it.  He states that he has not been seen by a primary care physician in over 7 years but has plans to get one.  The history is provided by the patient.    History reviewed. No pertinent past medical history.  There are no active problems to display for this patient.   History reviewed. No pertinent surgical history.      Home Medications    Prior to Admission medications   Medication Sig Start Date End Date Taking? Authorizing Provider  diclofenac (VOLTAREN) 75 MG EC tablet Take 1 tablet (75 mg total) by mouth 2 (two) times daily. 03/29/17  Yes Caryl Ada K, PA-C  naproxen sodium (ALEVE) 220 MG tablet Take 440 mg by mouth 2 (two) times daily as needed (pain).    Yes [provider]  lisinopril-hydrochlorothiazide (ZESTORETIC) 10-12.5  MG tablet Take 1 tablet by mouth daily. 05/24/17   Renita Papa, PA-C    Family History No family history on file.  Social History Social History   Tobacco Use  . Smoking status: Never Smoker  . Smokeless tobacco: Never Used  Substance Use Topics  . Alcohol use: No    Frequency: Never  . Drug use: No     Allergies   Patient has no known allergies.   Review of Systems Review of Systems  Constitutional: Negative for chills and fever.  Respiratory: Negative for shortness of breath.   Cardiovascular: Negative for chest pain.  Gastrointestinal: Negative for abdominal pain, nausea and vomiting.  Musculoskeletal: Positive for arthralgias.  Neurological: Negative for syncope, weakness, light-headedness and numbness.  All other systems reviewed and are negative.    Physical Exam Updated Vital Signs BP (!) 177/113   Pulse 98   Temp 98.5 F (36.9 C) (Oral)   Resp 17   SpO2 100%   Physical Exam  Constitutional: He is oriented to person, place, and time. He appears well-developed and well-nourished. No distress.  HENT:  Head: Normocephalic and atraumatic.  Eyes: Pupils are equal, round, and reactive to light. Conjunctivae and EOM are normal. Right eye exhibits no discharge. Left eye exhibits no discharge.  Neck: Normal range of motion. Neck supple. No JVD  present. No tracheal deviation present.  Cardiovascular: Regular rhythm, normal heart sounds and intact distal pulses.  Tachycardic, 2+ radial and DP/PT pulses bl, negative Homan's bl   Pulmonary/Chest: Effort normal and breath sounds normal. No stridor. No respiratory distress. He has no wheezes. He has no rales. He exhibits no tenderness.  Abdominal: Soft. Bowel sounds are normal. He exhibits no distension. There is no tenderness. There is no guarding.  Musculoskeletal: Normal range of motion. He exhibits tenderness. He exhibits no edema.  Tenderness to palpation of the dorsum of the left foot laterally overlying the  fourth and fifth metatarsals.  Mild swelling noted to the dorsum of the left foot.  No underlying crepitus, erythema, or warmth noted.  Examination of the Achilles tendon is normal.  5/5 strength of BLE major muscle groups. No ecchymosis. No ligamentous laxity.   Neurological: He is alert and oriented to person, place, and time. No cranial nerve deficit or sensory deficit.  Fluent speech, no facial droop, sensation intact to soft touch of bilateral lower extremities  Skin: Skin is warm and dry. No erythema.  Psychiatric: He has a normal mood and affect. His behavior is normal.  Nursing note and vitals reviewed.    ED Treatments / Results  Labs (all labs ordered are listed, but only abnormal results are displayed) Labs Reviewed  COMPREHENSIVE METABOLIC PANEL - Abnormal; Notable for the following components:      Result Value   Glucose, Bld 134 (*)    All other components within normal limits  URINALYSIS, ROUTINE W REFLEX MICROSCOPIC - Abnormal; Notable for the following components:   Leukocytes, UA SMALL (*)    All other components within normal limits  CBC WITH DIFFERENTIAL/PLATELET    EKG  ED ECG REPORT   Date: 05/24/2017  Rate: 110  Rhythm: sinus tachycardia  ST/T Wave abnormalities: normal  Conduction Disutrbances:none  Narrative Interpretation: Sinus tachycardia with possible old anteroseptal infarct  Old EKG Reviewed: none available  I have personally reviewed the EKG tracing and agree with the computerized printout as noted.    Radiology Dg Foot Complete Left  Result Date: 05/24/2017 CLINICAL DATA:  Pain, swelling on left foot; pt states that it ha been hurting a while but got really bad this morning, pt can not put pressure on the foot; most pain in lateral and dorsal aspect of the foot; EXAM: LEFT FOOT - COMPLETE 3+ VIEW COMPARISON:  None. FINDINGS: No acute fracture or dislocation. Small Achilles and calcaneal spurs. No significant soft tissue swelling. IMPRESSION:  No acute osseous abnormality. Electronically Signed   By: Abigail Miyamoto M.D.   On: 05/24/2017 12:10    Procedures Procedures (including critical care time)  Medications Ordered in ED Medications  sodium chloride 0.9 % bolus 1,000 mL (0 mLs Intravenous Stopped 05/24/17 1337)     Initial Impression / Assessment and Plan / ED Course  I have reviewed the triage vital signs and the nursing notes.  Pertinent labs & imaging results that were available during my care of the patient were reviewed by me and considered in my medical decision making (see chart for details).     Patient presents with acute onset of left foot pain for a few days.  Pain is atraumatic but patient states that he thinks his shoes for work are ill fitting and he had similar problems with his right foot 1 month ago.  He is afebrile, hypertensive and tachycardic initially.  He states he knows he has hypertension but does  not take any medications for it and does not have a primary care physician.  However, he explains to me that he is motivated to find a primary care physician because he has been missing work more recently due to his symptoms.  He is neurovascularly intact and is able to ambulate on his foot despite pain.  Presentation is not concerning for gout, septic joint, osteomyelitis, or necrotizing fasciitis.  There is no evidence of an overlying secondary skin infection.  Radiographs reviewed by me show no acute osseous abnormalities.  Presentation is also not concerning for DVT.  However with his hypertension and tachycardia we will obtain baseline labs and EKG to rule out other concerning abnormalities. EKG shows sinus tachycardia but no evidence of ST segment abnormality or arrhythmia concerning for ischemia.  Lab work reviewed by me shows no leukocytosis, anemia, electrolyte abnormalities, or abnormal kidney function.  UA is not concerning for UTI, nephrolithiasis, or new onset diabetes. Patient is stable for discharge  home with follow-up with a primary care physician and a podiatrist for his symptoms.  His heart rate significantly improved and returned to his baseline after the administration of IV fluids and he tells me he thinks he has been dehydrated which could certainly explain his tachycardia.  We discussed RICE therapy, recommend tylenol and elevation. He has crutches at home.  Discussed indications for return to the ED.  Patient and patient's wife verbalized understanding of and agreement with plan and patient is stable for discharge home at this time. Final Clinical Impressions(s) / ED Diagnoses   Final diagnoses:  Acute pain of left foot  Hypertension, unspecified type    ED Discharge Orders        Ordered    lisinopril-hydrochlorothiazide (ZESTORETIC) 10-12.5 MG tablet  Daily     05/24/17 1332       Renita Papa, PA-C 05/26/17 1114    Daleen Bo, MD 05/27/17 2031

## 2017-05-24 NOTE — ED Triage Notes (Addendum)
Patient here from home with complaints of left foot pain. Denies trauma. Reports "I think my shoes may be too tight".

## 2017-05-24 NOTE — Discharge Instructions (Signed)
Alternate 600 mg of ibuprofen and [phone] mg of Tylenol every 3 hours as needed for pain. Do not exceed 4000 mg of Tylenol daily.  Take ibuprofen with food to avoid upset stomach issues.  Elevate the foot when not ambulating.  Use crutches to ambulate.  Apply ice for comfort.  Follow-up with a podiatrist for reevaluation of your symptoms.  Start taking blood pressure medication.  It may caused increased urination as a side effect. Follow-up with a primary care physician for reevaluation of your high blood pressure.  Return to the emergency department if any concerning signs or symptoms develop such as fevers, redness or worsening swelling/pain of the foot, chest pain, or shortness of breath.

## 2017-07-16 ENCOUNTER — Ambulatory Visit (INDEPENDENT_AMBULATORY_CARE_PROVIDER_SITE_OTHER): Payer: 59 | Admitting: Podiatry

## 2017-07-16 ENCOUNTER — Encounter: Payer: Self-pay | Admitting: *Deleted

## 2017-07-16 ENCOUNTER — Telehealth: Payer: Self-pay | Admitting: Podiatry

## 2017-07-16 ENCOUNTER — Encounter: Payer: Self-pay | Admitting: Podiatry

## 2017-07-16 VITALS — BP 163/102 | HR 106

## 2017-07-16 DIAGNOSIS — M779 Enthesopathy, unspecified: Secondary | ICD-10-CM

## 2017-07-16 MED ORDER — TRIAMCINOLONE ACETONIDE 10 MG/ML IJ SUSP
10.0000 mg | Freq: Once | INTRAMUSCULAR | Status: AC
  Administered 2017-07-16: 10 mg

## 2017-07-16 MED ORDER — PREDNISONE 10 MG PO TABS: ORAL_TABLET | ORAL | 0 refills | Status: DC

## 2017-07-16 NOTE — Telephone Encounter (Signed)
Unable to leave a message- no voicemail set up

## 2017-07-16 NOTE — Progress Notes (Signed)
v

## 2017-07-16 NOTE — Progress Notes (Signed)
Subjective:   Patient ID: Mike [name], male   DOB: 47 y.o.   MRN: [id number]   HPI Patient presents stating that he is developed a lot of pain on the top and side of his left foot and is been going on now for the last few months.  Patient does not remember specific injury and has been to the emergency room twice in all that it was gave him an anti-inflammatory did not help him.  Patient does not smoke is obese and would like to be more active   Review of Systems  All other systems reviewed and are negative.       Objective:  Physical Exam  Constitutional: He appears well-developed and well-nourished.  Cardiovascular: Intact distal pulses.  Pulmonary/Chest: Effort normal.  Musculoskeletal: Normal range of motion.  Neurological: He is alert.  Skin: Skin is warm.  Nursing note and vitals reviewed.   Neurovascular status intact muscle strength is adequate range of motion within normal limits with patient noted to have exquisite discomfort dorsal lateral aspect left foot inflammation fluid around the tendon complex of the peroneal tendon group with quite a bit of swelling around the area.  Patient has good digital perfusion and the x-rays taken do not seem to indicate bony pathology     Assessment:  Appears to be acute peritoneal pathology with patient having had blood work which was negative for indications of systemic arthritic disease     Plan:  H&P condition reviewed with patient and careful injection of the lateral perineal complex accomplished today and I went ahead today and I did place an air fracture walker to completely immobilize.  I gave instructions on reduced activity ice therapy and placed him on a 12-day Deltasone prednisone Dosepak and we will see him back again in 2 weeks or earlier if needed

## 2017-07-16 NOTE — Telephone Encounter (Signed)
Dr. Paulla Dolly states pt may return to work 07/21/2017.

## 2017-07-16 NOTE — Telephone Encounter (Signed)
I was seen by Dr. Paulla Dolly today for the first time. After the appointment when I got home I was looking over my after visit summary papers. It did not state when he wanted me to return back to work. He wanted me to return back to work this coming Tuesday, 28 May but its not on that paper. Is there any way you guys can get me that information because I'm sure my job would need it. I would appreciate a call back and if you could let me know how I can go about getting that information. Thank you. Have a wonderful day.

## 2017-07-16 NOTE — Telephone Encounter (Signed)
I spoke with pt and informed that Dr. Paulla Dolly stated he could return to work if he felt he could work in the [employer]. 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

## 2017-07-17 NOTE — Telephone Encounter (Signed)
Pt states his employer has not responded to his work status in his open-toed boot, and he does not feel he can work safely in the boot, he will contact our office Tuesday.

## 2017-07-29 ENCOUNTER — Telehealth: Payer: Self-pay | Admitting: *Deleted

## 2017-07-29 NOTE — Telephone Encounter (Signed)
Pt presented to office with a broken strap to his cam boot. I exchanged the cam boot for a new one and fitted on pt before he left.

## 2017-08-07 ENCOUNTER — Ambulatory Visit: Payer: 59 | Admitting: Podiatry

## 2017-08-14 ENCOUNTER — Encounter: Payer: Self-pay | Admitting: Podiatry

## 2017-08-14 ENCOUNTER — Ambulatory Visit (INDEPENDENT_AMBULATORY_CARE_PROVIDER_SITE_OTHER): Payer: 59 | Admitting: Podiatry

## 2017-08-14 DIAGNOSIS — M7672 Peroneal tendinitis, left leg: Secondary | ICD-10-CM | POA: Diagnosis not present

## 2017-08-14 MED ORDER — DICLOFENAC SODIUM 75 MG PO TBEC: 75.0000 mg | DELAYED_RELEASE_TABLET | Freq: Two times a day (BID) | ORAL | 2 refills | Status: DC

## 2017-08-17 NOTE — Progress Notes (Signed)
Subjective:   Patient ID: Mike [name], male   DOB: 47 y.o.   MRN: [id number]   HPI Patient presents stating I wear the boot for 3 weeks and this is the first week and its been improving with mild discomfort   ROS      Objective:  Physical Exam  Neurovascular status intact with significant reduction of discomfort in the lateral side of the peroneal tendon at its insertion into the base of the fifth metatarsal with no increased fluid and what appears to be good muscle strength     Assessment:  Doing well post peroneal tendinitis left     Plan:  Advised this patient on physical therapy and supportive shoes and that ultimately it may require orthotics immobilization again.  Patient will be seen back as symptoms indicate and at this time can resume normal activities

## 2018-06-29 ENCOUNTER — Ambulatory Visit (INDEPENDENT_AMBULATORY_CARE_PROVIDER_SITE_OTHER): Payer: 59 | Admitting: Sports Medicine

## 2018-06-29 ENCOUNTER — Encounter: Payer: Self-pay | Admitting: Sports Medicine

## 2018-06-29 ENCOUNTER — Ambulatory Visit
Admission: RE | Admit: 2018-06-29 | Discharge: 2018-06-29 | Disposition: A | Discharge status: Home / Self Care | Payer: 59 | Source: Ambulatory Visit | Attending: Sports Medicine | Admitting: Sports Medicine

## 2018-06-29 ENCOUNTER — Other Ambulatory Visit: Payer: Self-pay

## 2018-06-29 VITALS — BP 188/117 | Ht 72.0 in | Wt 350.0 lb

## 2018-06-29 DIAGNOSIS — M25552 Pain in left hip: Secondary | ICD-10-CM

## 2018-06-29 MED ORDER — LISINOPRIL-HYDROCHLOROTHIAZIDE 10-12.5 MG PO TABS: 1.0000 | ORAL_TABLET | Freq: Every day | ORAL | 0 refills | Status: DC

## 2018-06-29 NOTE — Patient Instructions (Addendum)
  Try either Aspercreme or capsaicin topical for your pain You can also use ice as needed We will call you later today with the results of your x-ray

## 2018-06-29 NOTE — Progress Notes (Signed)
   Subjective:    Patient ID: Mike [name], male    DOB: 11-01-1970, 48 y.o.   MRN: [id number]  HPI chief complaint: Low back pain and left hip pain  Very pleasant 48 year old male comes in today complaining of 2 weeks of left-sided low back pain and left hip pain.  While sitting on a chair, the chair collapsed and he fell to the floor landing on the left side of his low back and posterior hip.  He has diffuse pain in this area.  It is intermittent.  It is most noticeable at night when turning over in bed.  He has no pain when sitting still.  He did not notice any bruising or swelling.  He denies pain in the groin.  He does get some radiating pain down the lateral aspect of his thigh but no pain past the knee.  No numbness or tingling.  Past medical history is reviewed.  He has a history of hypertension  Medications reviewed Allergies reviewed    Review of Systems As above    Objective:   Physical Exam  Morbidly obese.  No acute distress.  Vital signs reviewed.  Blood pressure noted to be 188/117  Lumbar spine: Good lumbar range of motion.  No tenderness over the lumbar midline.  He does have some diffuse tenderness along the left side of his paraspinal area with tenderness to palpation into the posterior hip.  No tenderness to palpation over the greater trochanteric bursa.  No palpable hematoma.  No swelling.  No ecchymosis.  Left hip: Smooth painless hip range of motion with a negative logroll.  Neurological exam: No gross neurological deficit of either lower extremity  X-rays of his lumbar spine and pelvis are reviewed.  No fracture is seen.  He does have degenerative changes of the lower lumbar spine.      Assessment & Plan:   Low back pain and posterior left hip pain secondary to contusion Uncontrolled hypertension  Reassurance regarding his x-rays.  I recommended topical Aspercreme or capsaicin as needed for pain.  Symptoms should resolve over the next 2 to 4 weeks.  If  they persist or worsen then I may need to consider further diagnostic imaging. In regards to his uncontrolled hypertension, he is currently without a PCP.  Blood pressure is extremely elevated today because he has been out of his blood pressure medicine for quite some time.  I have agreed to give him a one-month refill to last until he can find a new PCP.  He understands the importance in this.  Follow-up for ongoing or recalcitrant issues.

## 2018-07-21 ENCOUNTER — Other Ambulatory Visit: Payer: Self-pay | Admitting: Sports Medicine

## 2018-08-04 ENCOUNTER — Other Ambulatory Visit: Payer: Self-pay | Admitting: Sports Medicine

## 2018-09-03 ENCOUNTER — Ambulatory Visit (INDEPENDENT_AMBULATORY_CARE_PROVIDER_SITE_OTHER): Payer: 59

## 2018-09-03 ENCOUNTER — Encounter: Payer: Self-pay | Admitting: Podiatry

## 2018-09-03 ENCOUNTER — Other Ambulatory Visit: Payer: Self-pay

## 2018-09-03 ENCOUNTER — Ambulatory Visit (INDEPENDENT_AMBULATORY_CARE_PROVIDER_SITE_OTHER): Payer: 59 | Admitting: Podiatry

## 2018-09-03 ENCOUNTER — Other Ambulatory Visit: Payer: Self-pay | Admitting: Podiatry

## 2018-09-03 VITALS — Temp 97.4°F

## 2018-09-03 DIAGNOSIS — M79671 Pain in right foot: Secondary | ICD-10-CM

## 2018-09-03 DIAGNOSIS — R600 Localized edema: Secondary | ICD-10-CM | POA: Diagnosis not present

## 2018-09-03 DIAGNOSIS — M779 Enthesopathy, unspecified: Secondary | ICD-10-CM

## 2018-09-07 NOTE — Progress Notes (Signed)
Subjective:   Patient ID: Mike [name], male   DOB: 48 y.o.   MRN: [id number]   HPI Patient presents stating he is developed a lot of pain on top of his right foot that is become inflamed and making it hard for him to be active.  Patient states it is also swelling and he has trouble with ambulation associated with this with obesity being a complicating factor   ROS      Objective:  Physical Exam  Neurovascular status was found to be intact with negative Homans sign noted.  Patient is found to have exquisite discomfort in the dorsum of the right foot with inflammation of the tendon group and +2 pitting edema that extends into the ankle but not into the lower leg.     Assessment:  Pro ability for tendinitis with inflammatory condition and cannot rule out any kind of blood clot but does have negative Homans sign     Plan:  HP condition reviewed and today I went ahead and injected the dorsal tendon complex 3 mg Kenalog 5 mg Xylocaine and then applied Unna boot surgical shoe to take pressure off the foot.  Reappoint for [place] to recheck  X-ray indicates there is no indications of stress fracture or arthritis associated with this

## 2018-09-10 ENCOUNTER — Encounter: Payer: Self-pay | Admitting: Internal Medicine

## 2018-09-10 ENCOUNTER — Ambulatory Visit (INDEPENDENT_AMBULATORY_CARE_PROVIDER_SITE_OTHER): Payer: 59 | Admitting: Internal Medicine

## 2018-09-10 ENCOUNTER — Other Ambulatory Visit: Payer: Self-pay

## 2018-09-10 DIAGNOSIS — M79671 Pain in right foot: Secondary | ICD-10-CM | POA: Insufficient documentation

## 2018-09-10 DIAGNOSIS — I10 Essential (primary) hypertension: Secondary | ICD-10-CM | POA: Diagnosis not present

## 2018-09-10 MED ORDER — LISINOPRIL-HYDROCHLOROTHIAZIDE 10-12.5 MG PO TABS: 1.0000 | ORAL_TABLET | Freq: Every day | ORAL | 0 refills | Status: DC

## 2018-09-10 NOTE — Patient Instructions (Signed)
-  Nice meeting you today!!  -Referral to Sports Medicine for your foot.  -Schedule follow up as soon as possible for your physical. Please come in fasting.

## 2018-09-10 NOTE — Progress Notes (Signed)
New Patient Office Visit     CC/Reason for Visit: Establish care, follow-up chronic conditions, medication refills Previous PCP: Dr. Jani Gravel Last Visit: Has been years  HPI: Mike [name] is a 48 y.o. male who is coming in today for the above mentioned reasons. Past Medical History is significant for: Morbid obesity and uncontrolled hypertension.  He had been on lisinopril/hydrochlorothiazide years ago and when he recently saw his podiatrist they gave him a 30-day prescription but told him he needs to follow-up with the PCP for further refills.  He has been having issues with right foot pain and swelling for about a year.  Has seen podiatry 3 times.  His last visit was only 7 days ago at which time they gave him a cortisone shot to the dorsum of his right foot and applied an Haematologist.  Patient states the relief from the pain lasted probably 2 days.  He would like a referral to another physician for a second opinion.  He works at [employer] in [place], and is a very physical job.  He is married, has 2 children ages 16 and 40.  He is a never smoker, drinks 2-3 beers a day, used to smoke marijuana but has not in about 12 years.  His family history significant for both parents with hypertension and diabetes and a brother with diabetes.   Past Medical/Surgical History: Past Medical History:  Diagnosis Date  . Hypertension   . Morbid obesity ([place])     History reviewed. No pertinent surgical history.  Social History:  reports that he has never smoked. He has never used smokeless tobacco. He reports current alcohol use of about 21.0 standard drinks of alcohol per week. He reports that he does not use drugs.  Allergies: No Known Allergies  Family History:  Family History  Problem Relation Age of Onset  . Hypertension Mother   . Diabetes Mother   . Hypertension Father   . Diabetes Father   . Diabetes Brother      Current Outpatient Medications:  .   Bioflavonoid Products (BIOFLEX PO), Take 1 tablet by mouth daily., Disp: , Rfl:  .  lisinopril-hydrochlorothiazide (ZESTORETIC) 10-12.5 MG tablet, Take 1 tablet by mouth daily., Disp: 90 tablet, Rfl: 0 .  Multiple Vitamin (MULTIVITAMIN PO), Take 1 tablet by mouth daily., Disp: , Rfl:  .  Omega-3 Fatty Acids (FISH OIL PO), Take 1 tablet by mouth daily., Disp: , Rfl:  .  TURMERIC PO, Take 1 tablet by mouth daily., Disp: , Rfl:   Review of Systems:  Constitutional: Denies fever, chills, diaphoresis, appetite change and fatigue.  HEENT: Denies photophobia, eye pain, redness, hearing loss, ear pain, congestion, sore throat, rhinorrhea, sneezing, mouth sores, trouble swallowing, neck pain, neck stiffness and tinnitus.   Respiratory: Denies SOB, DOE, cough, chest tightness,  and wheezing.   Cardiovascular: Denies chest pain, palpitations and leg swelling.  Gastrointestinal: Denies nausea, vomiting, abdominal pain, diarrhea, constipation, blood in stool and abdominal distention.  Genitourinary: Denies dysuria, urgency, frequency, hematuria, flank pain and difficulty urinating.  Endocrine: Denies: hot or cold intolerance, sweats, changes in hair or nails, polyuria, polydipsia. Musculoskeletal: Denies myalgias, back pain, joint swelling, arthralgias and gait problem.  Skin: Denies pallor, rash and wound.  Neurological: Denies dizziness, seizures, syncope, weakness, light-headedness, numbness and headaches.  Hematological: Denies adenopathy. Easy bruising, personal or family bleeding history  Psychiatric/Behavioral: Denies suicidal ideation, mood changes, confusion, nervousness, sleep disturbance and agitation    Physical Exam:  Vitals:   09/10/18 1416  BP: 130/90  Pulse: 98  Temp: 99.2 F (37.3 C)  TempSrc: Oral  SpO2: 95%  Weight: (!) 359 lb (162.8 kg)  Height: 6' (1.829 m)   Body mass index is 48.69 kg/m.  Constitutional: NAD, calm, comfortable, obese Eyes: PERRL, lids and conjunctivae  normal ENMT: Mucous membranes are moist.  Respiratory: clear to auscultation bilaterally, no wheezing, no crackles. Normal respiratory effort. No accessory muscle use.  Cardiovascular: Regular rate and rhythm, no murmurs / rubs / gallops. No extremity edema. 2+ pedal pulses. No carotid bruits.   Musculoskeletal: no clubbing / cyanosis. No joint deformity upper and lower extremities. Good ROM, no contractures. Normal muscle tone.  Swelling to the dorsum of the right foot, no pain to palpation Skin: no rashes, lesions, ulcers. No induration Neurologic: Grossly intact and nonfocal  psychiatric: Normal judgment and insight. Alert and oriented x 3. Normal mood.    Impression and Plan:  Essential hypertension  -Blood pressure still a little elevated today, however he just started taking the blood pressure medication a week ago.  No further changes for now.  Morbid obesity ([place]) -Discussed healthy lifestyle, including increased physical activity and better food choices to promote weight loss.  Right foot pain  -He does have swelling to the dorsum of the right foot, I can see a note from podiatry on 7/10 where x-rays were negative.  I can only imagine that this must be some form of tendinitis.  Per patient request will refer to Dr. Tamala Julian with sports medicine.     Patient Instructions  -Nice meeting you today!!  -Referral to Sports Medicine for your foot.  -Schedule follow up as soon as possible for your physical. Please come in fasting.     Lelon Frohlich, MD [facility]

## 2018-09-17 ENCOUNTER — Other Ambulatory Visit: Payer: Self-pay

## 2018-09-17 ENCOUNTER — Ambulatory Visit (INDEPENDENT_AMBULATORY_CARE_PROVIDER_SITE_OTHER): Payer: 59 | Admitting: Podiatry

## 2018-09-17 ENCOUNTER — Encounter: Payer: Self-pay | Admitting: Podiatry

## 2018-09-17 VITALS — Temp 98.0°F

## 2018-09-17 DIAGNOSIS — R600 Localized edema: Secondary | ICD-10-CM

## 2018-09-17 DIAGNOSIS — M79671 Pain in right foot: Secondary | ICD-10-CM

## 2018-09-17 DIAGNOSIS — M779 Enthesopathy, unspecified: Secondary | ICD-10-CM

## 2018-09-23 NOTE — Progress Notes (Signed)
Subjective:   Patient ID: Mike [name], male   DOB: 48 y.o.   MRN: [id number]   HPI Patient presents stating my right foot swelling has gone down a lot still gets some discomfort but I am improved and I got new shoes which seems to be helping   ROS      Objective:  Physical Exam  Neurovascular status intact with patient's right swelling reducing at this time with mild inflammation still noted but improvement from previous     Assessment:  Improvement of dorsal inflammatory process right with diminished discomfort swelling noted     Plan:  H&P reviewed condition recommended continued compression elevation and weight loss if possible.  Patient's discharge will be seen back as needed

## 2018-09-24 ENCOUNTER — Ambulatory Visit (INDEPENDENT_AMBULATORY_CARE_PROVIDER_SITE_OTHER): Payer: 59 | Admitting: Internal Medicine

## 2018-09-24 ENCOUNTER — Other Ambulatory Visit: Payer: Self-pay

## 2018-09-24 ENCOUNTER — Encounter: Payer: Self-pay | Admitting: Internal Medicine

## 2018-09-24 VITALS — BP 140/100 | HR 100 | Temp 98.5°F | Ht 73.0 in | Wt 354.1 lb

## 2018-09-24 DIAGNOSIS — Z23 Encounter for immunization: Secondary | ICD-10-CM | POA: Diagnosis not present

## 2018-09-24 DIAGNOSIS — M79671 Pain in right foot: Secondary | ICD-10-CM

## 2018-09-24 DIAGNOSIS — Z0000 Encounter for general adult medical examination without abnormal findings: Secondary | ICD-10-CM | POA: Diagnosis not present

## 2018-09-24 DIAGNOSIS — I10 Essential (primary) hypertension: Secondary | ICD-10-CM | POA: Diagnosis not present

## 2018-09-24 DIAGNOSIS — Z114 Encounter for screening for human immunodeficiency virus [HIV]: Secondary | ICD-10-CM

## 2018-09-24 LAB — LIPID PANEL
Cholesterol: 165 mg/dL (ref 0–200)
HDL: 51.1 mg/dL (ref >39.00)
LDL Cholesterol: 97 mg/dL (ref 0–99)
NonHDL: 113.58
Total CHOL/HDL Ratio: 3
Triglycerides: 83 mg/dL (ref 0.0–149.0)
VLDL: 16.6 mg/dL (ref 0.0–40.0)

## 2018-09-24 LAB — COMPREHENSIVE METABOLIC PANEL
ALT: 26 U/L (ref 0–53)
AST: 21 U/L (ref 0–37)
Albumin: 4.3 g/dL (ref 3.5–5.2)
Alkaline Phosphatase: 67 U/L (ref 39–117)
BUN: 12 mg/dL (ref 6–23)
CO2: 26 mEq/L (ref 19–32)
Calcium: 9.7 mg/dL (ref 8.4–10.5)
Chloride: 100 mEq/L (ref 96–112)
Creatinine, Ser: 0.8 mg/dL (ref 0.40–1.50)
GFR: 125.19 mL/min (ref >60.00)
Glucose, Bld: 92 mg/dL (ref 70–99)
Potassium: 3.7 mEq/L (ref 3.5–5.1)
Sodium: 136 mEq/L (ref 135–145)
Total Bilirubin: 0.6 mg/dL (ref 0.2–1.2)
Total Protein: 7.2 g/dL (ref 6.0–8.3)

## 2018-09-24 LAB — CBC WITH DIFFERENTIAL/PLATELET
Basophils Absolute: 0 10*3/uL (ref 0.0–0.1)
Basophils Relative: 0.5 % (ref 0.0–3.0)
Eosinophils Absolute: 0.1 10*3/uL (ref 0.0–0.7)
Eosinophils Relative: 2 % (ref 0.0–5.0)
HCT: 42.2 % (ref 39.0–52.0)
Hemoglobin: 14 g/dL (ref 13.0–17.0)
Lymphocytes Relative: 32.5 % (ref 12.0–46.0)
Lymphs Abs: 1.7 10*3/uL (ref 0.7–4.0)
MCHC: 33.1 g/dL (ref 30.0–36.0)
MCV: 88.6 fl (ref 78.0–100.0)
Monocytes Absolute: 0.4 10*3/uL (ref 0.1–1.0)
Monocytes Relative: 8.2 % (ref 3.0–12.0)
Neutro Abs: 2.9 10*3/uL (ref 1.4–7.7)
Neutrophils Relative %: 56.8 % (ref 43.0–77.0)
Platelets: 245 10*3/uL (ref 150.0–400.0)
RBC: 4.77 Mil/uL (ref 4.22–5.81)
RDW: 13.8 % (ref 11.5–15.5)
WBC: 5.1 10*3/uL (ref 4.0–10.5)

## 2018-09-24 LAB — HEMOGLOBIN A1C: Hgb A1c MFr Bld: 5.6 % (ref 4.6–6.5)

## 2018-09-24 LAB — VITAMIN B12: Vitamin B-12: 332 pg/mL (ref 211–911)

## 2018-09-24 LAB — TSH: TSH: 2.48 u[IU]/mL (ref 0.35–4.50)

## 2018-09-24 LAB — VITAMIN D 25 HYDROXY (VIT D DEFICIENCY, FRACTURES): VITD: 31.05 ng/mL (ref 30.00–100.00)

## 2018-09-24 NOTE — Progress Notes (Signed)
Established Patient Office Visit     CC/Reason for Visit: Annual preventive exam  HPI: Mike [name] is a 48 y.o. male who is coming in today for the above mentioned reasons. Past Medical History is significant for: Uncontrolled hypertension, morbid obesity, right foot pain and swelling.  He has no acute complaints today.  We have discussed tetanus vaccine.  He needs routine eye and dental care.   Past Medical/Surgical History: Past Medical History:  Diagnosis Date  . Hypertension   . Morbid obesity ([place])     No past surgical history on file.  Social History:  reports that he has never smoked. He has never used smokeless tobacco. He reports current alcohol use of about 21.0 standard drinks of alcohol per week. He reports that he does not use drugs.  Allergies: No Known Allergies  Family History:  Family History  Problem Relation Age of Onset  . Hypertension Mother   . Diabetes Mother   . Hypertension Father   . Diabetes Father   . Diabetes Brother      Current Outpatient Medications:  .  Bioflavonoid Products (BIOFLEX PO), Take 1 tablet by mouth daily., Disp: , Rfl:  .  lisinopril-hydrochlorothiazide (ZESTORETIC) 10-12.5 MG tablet, Take 1 tablet by mouth daily., Disp: 90 tablet, Rfl: 0 .  Multiple Vitamin (MULTIVITAMIN PO), Take 1 tablet by mouth daily., Disp: , Rfl:  .  Omega-3 Fatty Acids (FISH OIL PO), Take 1 tablet by mouth daily., Disp: , Rfl:  .  TURMERIC PO, Take 1 tablet by mouth daily., Disp: , Rfl:   Review of Systems:  Constitutional: Denies fever, chills, diaphoresis, appetite change and fatigue.  HEENT: Denies photophobia, eye pain, redness, hearing loss, ear pain, congestion, sore throat, rhinorrhea, sneezing, mouth sores, trouble swallowing, neck pain, neck stiffness and tinnitus.   Respiratory: Denies SOB, DOE, cough, chest tightness,  and wheezing.   Cardiovascular: Denies chest pain, palpitations and leg swelling.  Gastrointestinal: Denies  nausea, vomiting, abdominal pain, diarrhea, constipation, blood in stool and abdominal distention.  Genitourinary: Denies dysuria, urgency, frequency, hematuria, flank pain and difficulty urinating.  Endocrine: Denies: hot or cold intolerance, sweats, changes in hair or nails, polyuria, polydipsia. Musculoskeletal: Denies myalgias, back pain, joint swelling, arthralgias and gait problem.  Skin: Denies pallor, rash and wound.  Neurological: Denies dizziness, seizures, syncope, weakness, light-headedness, numbness and headaches.  Hematological: Denies adenopathy. Easy bruising, personal or family bleeding history  Psychiatric/Behavioral: Denies suicidal ideation, mood changes, confusion, nervousness, sleep disturbance and agitation    Physical Exam: Vitals:   09/24/18 0819  BP: (!) 140/100  Pulse: 100  Temp: 98.5 F (36.9 C)  TempSrc: Oral  SpO2: 97%  Weight: (!) 354 lb 1.6 oz (160.6 kg)  Height: _0  (1.854 m)    Body mass index is 46.72 kg/m.   Constitutional: NAD, calm, comfortable, morbidly obese Eyes: PERRL, lids and conjunctivae normal ENMT: Mucous membranes are moist. Tympanic membrane is pearly white, no erythema or bulging. Neck: normal, supple, no masses, no thyromegaly Respiratory: clear to auscultation bilaterally, no wheezing, no crackles. Normal respiratory effort. No accessory muscle use.  Cardiovascular: Regular rate and rhythm, no murmurs / rubs / gallops. No extremity edema. 2+ pedal pulses. No carotid bruits.  Abdomen: no tenderness, no masses palpated. No hepatosplenomegaly. Bowel sounds positive.  Musculoskeletal: no clubbing / cyanosis. No joint deformity upper and lower extremities. Good ROM, no contractures. Normal muscle tone.  Skin: no rashes, lesions, ulcers. No induration Neurologic: CN 2-12 grossly  intact. Sensation intact, DTR normal. Strength 5/5 in all 4.  Psychiatric: Normal judgment and insight. Alert and oriented x 3. Normal mood.     Impression and Plan:  Encounter for preventive health examination  -Have advised routine eye and dental care. -Will receive tetanus vaccination today, otherwise immunizations are up-to-date. -Healthy lifestyle has been discussed in detail today. -Screening labs to be performed today. -Routine colon cancer screening to start at age 43.  Essential hypertension  -Uncontrolled, is taking medication on a daily basis. -States he can do better with low-sodium diet. -He would like a grace period to do ambulatory blood pressure monitoring as he states he was nervous to come in the office today. -Will check blood pressure to 3 times a week at home and follow-up in 8 weeks, if still elevated may need to adjust antihypertensive management.  Morbid obesity ([place]) -Discussed healthy lifestyle, including increased physical activity and better food choices to promote weight loss.  Right foot pain -Has follow-up with sports medicine in August.  Encounter for screening for HIV  - Plan: HIV antibody (with reflex)    Patient Instructions  -Nice seeing you today!!  -Lab work today; will notify you once results are available.  -Monitor BP at home 2-3 a weeks and bring numbers in to next visit.  -Schedule follow up in 6-8 weeks.  -Low salt diet (info below).  -Tetanus vaccine today.  -Remember to schedule eye and dental care.   Preventive Care 50-41 Years Old, Male Preventive care refers to lifestyle choices and visits with your health care provider that can promote health and wellness. This includes:  A yearly physical exam. This is also called an annual well check.  Regular dental and eye exams.  Immunizations.  Screening for certain conditions.  Healthy lifestyle choices, such as eating a healthy diet, getting regular exercise, not using drugs or products that contain nicotine and tobacco, and limiting alcohol use. What can I expect for my preventive care visit? Physical exam Your  health care provider will check:  Height and weight. These may be used to calculate body mass index (BMI), which is a measurement that tells if you are at a healthy weight.  Heart rate and blood pressure.  Your skin for abnormal spots. Counseling Your health care provider may ask you questions about:  Alcohol, tobacco, and drug use.  Emotional well-being.  Home and relationship well-being.  Sexual activity.  Eating habits.  Work and work Statistician. What immunizations do I need?  Influenza (flu) vaccine  This is recommended every year. Tetanus, diphtheria, and pertussis (Tdap) vaccine  You may need a Td booster every 10 years. Varicella (chickenpox) vaccine  You may need this vaccine if you have not already been vaccinated. Zoster (shingles) vaccine  You may need this after age 23. Measles, mumps, and rubella (MMR) vaccine  You may need at least one dose of MMR if you were born in 1957 or later. You may also need a second dose. Pneumococcal conjugate (PCV13) vaccine  You may need this if you have certain conditions and were not previously vaccinated. Pneumococcal polysaccharide (PPSV23) vaccine  You may need one or two doses if you smoke cigarettes or if you have certain conditions. Meningococcal conjugate (MenACWY) vaccine  You may need this if you have certain conditions. Hepatitis A vaccine  You may need this if you have certain conditions or if you travel or work in places where you may be exposed to hepatitis A. Hepatitis B vaccine  You may need this if you have certain conditions or if you travel or work in places where you may be exposed to hepatitis B. Haemophilus influenzae type b (Hib) vaccine  You may need this if you have certain risk factors. Human papillomavirus (HPV) vaccine  If recommended by your health care provider, you may need three doses over 6 months. You may receive vaccines as individual doses or as more than one vaccine together in  one shot (combination vaccines). Talk with your health care provider about the risks and benefits of combination vaccines. What tests do I need? Blood tests  Lipid and cholesterol levels. These may be checked every 5 years, or more frequently if you are over 58 years old.  Hepatitis C test.  Hepatitis B test. Screening  Lung cancer screening. You may have this screening every year starting at age 72 if you have a 30-pack-year history of smoking and currently smoke or have quit within the past 15 years.  Prostate cancer screening. Recommendations will vary depending on your family history and other risks.  Colorectal cancer screening. All adults should have this screening starting at age 65 and continuing until age 28. Your health care provider may recommend screening at age 83 if you are at increased risk. You will have tests every 1-10 years, depending on your results and the type of screening test.  Diabetes screening. This is done by checking your blood sugar (glucose) after you have not eaten for a while (fasting). You may have this done every 1-3 years.  Sexually transmitted disease (STD) testing. Follow these instructions at home: Eating and drinking  Eat a diet that includes fresh fruits and vegetables, whole grains, lean protein, and low-fat dairy products.  Take vitamin and mineral supplements as recommended by your health care provider.  Do not drink alcohol if your health care provider tells you not to drink.  If you drink alcohol: ? Limit how much you have to 0-2 drinks a day. ? Be aware of how much alcohol is in your drink. In the [place] one drink equals one 12 oz bottle of beer (355 mL), one 5 oz glass of wine (148 mL), or one 1 oz glass of hard liquor (44 mL). Lifestyle  Take daily care of your teeth and gums.  Stay active. Exercise for at least 30 minutes on 5 or more days each week.  Do not use any products that contain nicotine or tobacco, such as cigarettes,  e-cigarettes, and chewing tobacco. If you need help quitting, ask your health care provider.  If you are sexually active, practice safe sex. Use a condom or other form of protection to prevent STIs (sexually transmitted infections).  Talk with your health care provider about taking a low-dose aspirin every day starting at age 76. What's next?  Go to your health care provider once a year for a well check visit.  Ask your health care provider how often you should have your eyes and teeth checked.  Stay up to date on all vaccines. This information is not intended to replace advice given to you by your health care provider. Make sure you discuss any questions you have with your health care provider. Document Released: 03/09/2015 Document Revised: 02/04/2018 Document Reviewed: 02/04/2018 Elsevier Patient Education  2020 [place] DASH stands for "Dietary Approaches to Stop Hypertension." The DASH eating plan is a healthy eating plan that has been shown to reduce high blood pressure (hypertension). It may also  reduce your risk for type 2 diabetes, heart disease, and stroke. The DASH eating plan may also help with weight loss. What are tips for following this plan?  General guidelines  Avoid eating more than 2,300 mg (milligrams) of salt (sodium) a day. If you have hypertension, you may need to reduce your sodium intake to 1,500 mg a day.  Limit alcohol intake to no more than 1 drink a day for nonpregnant women and 2 drinks a day for men. One drink equals 12 oz of beer, 5 oz of wine, or 1 oz of hard liquor.  Work with your health care provider to maintain a healthy body weight or to lose weight. Ask what an ideal weight is for you.  Get at least 30 minutes of exercise that causes your heart to beat faster (aerobic exercise) most days of the week. Activities may include walking, swimming, or biking.  Work with your health care provider or diet and nutrition specialist  (dietitian) to adjust your eating plan to your individual calorie needs. Reading food labels   Check food labels for the amount of sodium per serving. Choose foods with less than 5 percent of the Daily Value of sodium. Generally, foods with less than 300 mg of sodium per serving fit into this eating plan.  To find whole grains, look for the word "whole" as the first word in the ingredient list. Shopping  Buy products labeled as "low-sodium" or "no salt added."  Buy fresh foods. Avoid canned foods and premade or frozen meals. Cooking  Avoid adding salt when cooking. Use salt-free seasonings or herbs instead of table salt or sea salt. Check with your health care provider or pharmacist before using salt substitutes.  Do not fry foods. Cook foods using healthy methods such as baking, boiling, grilling, and broiling instead.  Cook with heart-healthy oils, such as olive, canola, soybean, or sunflower oil. Meal planning  Eat a balanced diet that includes: ? 5 or more servings of fruits and vegetables each day. At each meal, try to fill half of your plate with fruits and vegetables. ? Up to 6-8 servings of whole grains each day. ? Less than 6 oz of lean meat, poultry, or fish each day. A 3-oz serving of meat is about the same size as a deck of cards. One egg equals 1 oz. ? 2 servings of low-fat dairy each day. ? A serving of nuts, seeds, or beans 5 times each week. ? Heart-healthy fats. Healthy fats called Omega-3 fatty acids are found in foods such as flaxseeds and coldwater fish, like sardines, salmon, and mackerel.  Limit how much you eat of the following: ? Canned or prepackaged foods. ? Food that is high in trans fat, such as fried foods. ? Food that is high in saturated fat, such as fatty meat. ? Sweets, desserts, sugary drinks, and other foods with added sugar. ? Full-fat dairy products.  Do not salt foods before eating.  Try to eat at least 2 vegetarian meals each week.  Eat  more home-cooked food and less restaurant, buffet, and fast food.  When eating at a restaurant, ask that your food be prepared with less salt or no salt, if possible. What foods are recommended? The items listed may not be a complete list. Talk with your dietitian about what dietary choices are best for you. Grains Whole-grain or whole-wheat bread. Whole-grain or whole-wheat pasta. Brown rice. [place]. Bulgur. Whole-grain and low-sodium cereals. Pita bread. Low-fat, low-sodium crackers. Whole-wheat flour  tortillas. Vegetables Fresh or frozen vegetables (raw, steamed, roasted, or grilled). Low-sodium or reduced-sodium tomato and vegetable juice. Low-sodium or reduced-sodium tomato sauce and tomato paste. Low-sodium or reduced-sodium canned vegetables. Fruits All fresh, dried, or frozen fruit. Canned fruit in natural juice (without added sugar). Meat and other protein foods Skinless chicken or [place]. Ground chicken or [place]. Pork with fat trimmed off. Fish and seafood. Egg whites. Dried beans, peas, or lentils. Unsalted nuts, nut butters, and seeds. Unsalted canned beans. Lean cuts of beef with fat trimmed off. Low-sodium, lean deli meat. Dairy Low-fat (1%) or fat-free (skim) milk. Fat-free, low-fat, or reduced-fat cheeses. Nonfat, low-sodium ricotta or cottage cheese. Low-fat or nonfat yogurt. Low-fat, low-sodium cheese. Fats and oils Soft margarine without trans fats. Vegetable oil. Low-fat, reduced-fat, or light mayonnaise and salad dressings (reduced-sodium). Canola, safflower, olive, soybean, and sunflower oils. Avocado. Seasoning and other foods Herbs. Spices. Seasoning mixes without salt. Unsalted popcorn and pretzels. Fat-free sweets. What foods are not recommended? The items listed may not be a complete list. Talk with your dietitian about what dietary choices are best for you. Grains Baked goods made with fat, such as croissants, muffins, or some breads. Dry pasta or rice meal  packs. Vegetables Creamed or fried vegetables. Vegetables in a cheese sauce. Regular canned vegetables (not low-sodium or reduced-sodium). Regular canned tomato sauce and paste (not low-sodium or reduced-sodium). Regular tomato and vegetable juice (not low-sodium or reduced-sodium). Angie Fava. Olives. Fruits Canned fruit in a light or heavy syrup. Fried fruit. Fruit in cream or butter sauce. Meat and other protein foods Fatty cuts of meat. Ribs. Fried meat. Berniece Salines. Sausage. Bologna and other processed lunch meats. Salami. Fatback. Hotdogs. Bratwurst. Salted nuts and seeds. Canned beans with added salt. Canned or smoked fish. Whole eggs or egg yolks. Chicken or [place] with skin. Dairy Whole or 2% milk, cream, and half-and-half. Whole or full-fat cream cheese. Whole-fat or sweetened yogurt. Full-fat cheese. Nondairy creamers. Whipped toppings. Processed cheese and cheese spreads. Fats and oils Butter. Stick margarine. Lard. Shortening. Ghee. Bacon fat. Tropical oils, such as coconut, palm kernel, or palm oil. Seasoning and other foods Salted popcorn and pretzels. Onion salt, garlic salt, seasoned salt, table salt, and sea salt. Worcestershire sauce. Tartar sauce. Barbecue sauce. Teriyaki sauce. Soy sauce, including reduced-sodium. Steak sauce. Canned and packaged gravies. Fish sauce. Oyster sauce. Cocktail sauce. Horseradish that you find on the shelf. Ketchup. Mustard. Meat flavorings and tenderizers. Bouillon cubes. Hot sauce and Tabasco sauce. Premade or packaged marinades. Premade or packaged taco seasonings. Relishes. Regular salad dressings. Where to find more information:  National Heart, Lung, and [place]: https://wilson-eaton.com/  American Heart Association: www.heart.org Summary  The DASH eating plan is a healthy eating plan that has been shown to reduce high blood pressure (hypertension). It may also reduce your risk for type 2 diabetes, heart disease, and stroke.  With the DASH eating  plan, you should limit salt (sodium) intake to 2,300 mg a day. If you have hypertension, you may need to reduce your sodium intake to 1,500 mg a day.  When on the DASH eating plan, aim to eat more fresh fruits and vegetables, whole grains, lean proteins, low-fat dairy, and heart-healthy fats.  Work with your health care provider or diet and nutrition specialist (dietitian) to adjust your eating plan to your individual calorie needs. This information is not intended to replace advice given to you by your health care provider. Make sure you discuss any questions you have with your health care provider.  Document Released: 01/30/2011 Document Revised: 01/23/2017 Document Reviewed: 02/04/2016 Elsevier Patient Education  2020 [place], [place] [facility]

## 2018-09-24 NOTE — Patient Instructions (Signed)
-Nice seeing you today!!  -Lab work today; will notify you once results are available.  -Monitor BP at home 2-3 a weeks and bring numbers in to next visit.  -Schedule follow up in 6-8 weeks.  -Low salt diet (info below).  -Tetanus vaccine today.  -Remember to schedule eye and dental care.   Preventive Care 66-47 Years Old, Male Preventive care refers to lifestyle choices and visits with your health care provider that can promote health and wellness. This includes:  A yearly physical exam. This is also called an annual well check.  Regular dental and eye exams.  Immunizations.  Screening for certain conditions.  Healthy lifestyle choices, such as eating a healthy diet, getting regular exercise, not using drugs or products that contain nicotine and tobacco, and limiting alcohol use. What can I expect for my preventive care visit? Physical exam Your health care provider will check:  Height and weight. These may be used to calculate body mass index (BMI), which is a measurement that tells if you are at a healthy weight.  Heart rate and blood pressure.  Your skin for abnormal spots. Counseling Your health care provider may ask you questions about:  Alcohol, tobacco, and drug use.  Emotional well-being.  Home and relationship well-being.  Sexual activity.  Eating habits.  Work and work Statistician. What immunizations do I need?  Influenza (flu) vaccine  This is recommended every year. Tetanus, diphtheria, and pertussis (Tdap) vaccine  You may need a Td booster every 10 years. Varicella (chickenpox) vaccine  You may need this vaccine if you have not already been vaccinated. Zoster (shingles) vaccine  You may need this after age 23. Measles, mumps, and rubella (MMR) vaccine  You may need at least one dose of MMR if you were born in 1957 or later. You may also need a second dose. Pneumococcal conjugate (PCV13) vaccine  You may need this if you have certain  conditions and were not previously vaccinated. Pneumococcal polysaccharide (PPSV23) vaccine  You may need one or two doses if you smoke cigarettes or if you have certain conditions. Meningococcal conjugate (MenACWY) vaccine  You may need this if you have certain conditions. Hepatitis A vaccine  You may need this if you have certain conditions or if you travel or work in places where you may be exposed to hepatitis A. Hepatitis B vaccine  You may need this if you have certain conditions or if you travel or work in places where you may be exposed to hepatitis B. Haemophilus influenzae type b (Hib) vaccine  You may need this if you have certain risk factors. Human papillomavirus (HPV) vaccine  If recommended by your health care provider, you may need three doses over 6 months. You may receive vaccines as individual doses or as more than one vaccine together in one shot (combination vaccines). Talk with your health care provider about the risks and benefits of combination vaccines. What tests do I need? Blood tests  Lipid and cholesterol levels. These may be checked every 5 years, or more frequently if you are over 24 years old.  Hepatitis C test.  Hepatitis B test. Screening  Lung cancer screening. You may have this screening every year starting at age 2 if you have a 30-pack-year history of smoking and currently smoke or have quit within the past 15 years.  Prostate cancer screening. Recommendations will vary depending on your family history and other risks.  Colorectal cancer screening. All adults should have this screening starting at  age 54 and continuing until age 54. Your health care provider may recommend screening at age 5 if you are at increased risk. You will have tests every 1-10 years, depending on your results and the type of screening test.  Diabetes screening. This is done by checking your blood sugar (glucose) after you have not eaten for a while (fasting). You may  have this done every 1-3 years.  Sexually transmitted disease (STD) testing. Follow these instructions at home: Eating and drinking  Eat a diet that includes fresh fruits and vegetables, whole grains, lean protein, and low-fat dairy products.  Take vitamin and mineral supplements as recommended by your health care provider.  Do not drink alcohol if your health care provider tells you not to drink.  If you drink alcohol: ? Limit how much you have to 0-2 drinks a day. ? Be aware of how much alcohol is in your drink. In the [place] one drink equals one 12 oz bottle of beer (355 mL), one 5 oz glass of wine (148 mL), or one 1 oz glass of hard liquor (44 mL). Lifestyle  Take daily care of your teeth and gums.  Stay active. Exercise for at least 30 minutes on 5 or more days each week.  Do not use any products that contain nicotine or tobacco, such as cigarettes, e-cigarettes, and chewing tobacco. If you need help quitting, ask your health care provider.  If you are sexually active, practice safe sex. Use a condom or other form of protection to prevent STIs (sexually transmitted infections).  Talk with your health care provider about taking a low-dose aspirin every day starting at age 40. What's next?  Go to your health care provider once a year for a well check visit.  Ask your health care provider how often you should have your eyes and teeth checked.  Stay up to date on all vaccines. This information is not intended to replace advice given to you by your health care provider. Make sure you discuss any questions you have with your health care provider. Document Released: 03/09/2015 Document Revised: 02/04/2018 Document Reviewed: 02/04/2018 Elsevier Patient Education  2020 [place] DASH stands for "Dietary Approaches to Stop Hypertension." The DASH eating plan is a healthy eating plan that has been shown to reduce high blood pressure (hypertension). It may also  reduce your risk for type 2 diabetes, heart disease, and stroke. The DASH eating plan may also help with weight loss. What are tips for following this plan?  General guidelines  Avoid eating more than 2,300 mg (milligrams) of salt (sodium) a day. If you have hypertension, you may need to reduce your sodium intake to 1,500 mg a day.  Limit alcohol intake to no more than 1 drink a day for nonpregnant women and 2 drinks a day for men. One drink equals 12 oz of beer, 5 oz of wine, or 1 oz of hard liquor.  Work with your health care provider to maintain a healthy body weight or to lose weight. Ask what an ideal weight is for you.  Get at least 30 minutes of exercise that causes your heart to beat faster (aerobic exercise) most days of the week. Activities may include walking, swimming, or biking.  Work with your health care provider or diet and nutrition specialist (dietitian) to adjust your eating plan to your individual calorie needs. Reading food labels   Check food labels for the amount of sodium per serving. Choose foods  with less than 5 percent of the Daily Value of sodium. Generally, foods with less than 300 mg of sodium per serving fit into this eating plan.  To find whole grains, look for the word "whole" as the first word in the ingredient list. Shopping  Buy products labeled as "low-sodium" or "no salt added."  Buy fresh foods. Avoid canned foods and premade or frozen meals. Cooking  Avoid adding salt when cooking. Use salt-free seasonings or herbs instead of table salt or sea salt. Check with your health care provider or pharmacist before using salt substitutes.  Do not fry foods. Cook foods using healthy methods such as baking, boiling, grilling, and broiling instead.  Cook with heart-healthy oils, such as olive, canola, soybean, or sunflower oil. Meal planning  Eat a balanced diet that includes: ? 5 or more servings of fruits and vegetables each day. At each meal, try to  fill half of your plate with fruits and vegetables. ? Up to 6-8 servings of whole grains each day. ? Less than 6 oz of lean meat, poultry, or fish each day. A 3-oz serving of meat is about the same size as a deck of cards. One egg equals 1 oz. ? 2 servings of low-fat dairy each day. ? A serving of nuts, seeds, or beans 5 times each week. ? Heart-healthy fats. Healthy fats called Omega-3 fatty acids are found in foods such as flaxseeds and coldwater fish, like sardines, salmon, and mackerel.  Limit how much you eat of the following: ? Canned or prepackaged foods. ? Food that is high in trans fat, such as fried foods. ? Food that is high in saturated fat, such as fatty meat. ? Sweets, desserts, sugary drinks, and other foods with added sugar. ? Full-fat dairy products.  Do not salt foods before eating.  Try to eat at least 2 vegetarian meals each week.  Eat more home-cooked food and less restaurant, buffet, and fast food.  When eating at a restaurant, ask that your food be prepared with less salt or no salt, if possible. What foods are recommended? The items listed may not be a complete list. Talk with your dietitian about what dietary choices are best for you. Grains Whole-grain or whole-wheat bread. Whole-grain or whole-wheat pasta. Brown rice. [place]. Bulgur. Whole-grain and low-sodium cereals. Pita bread. Low-fat, low-sodium crackers. Whole-wheat flour tortillas. Vegetables Fresh or frozen vegetables (raw, steamed, roasted, or grilled). Low-sodium or reduced-sodium tomato and vegetable juice. Low-sodium or reduced-sodium tomato sauce and tomato paste. Low-sodium or reduced-sodium canned vegetables. Fruits All fresh, dried, or frozen fruit. Canned fruit in natural juice (without added sugar). Meat and other protein foods Skinless chicken or [place]. Ground chicken or [place]. Pork with fat trimmed off. Fish and seafood. Egg whites. Dried beans, peas, or lentils. Unsalted nuts,  nut butters, and seeds. Unsalted canned beans. Lean cuts of beef with fat trimmed off. Low-sodium, lean deli meat. Dairy Low-fat (1%) or fat-free (skim) milk. Fat-free, low-fat, or reduced-fat cheeses. Nonfat, low-sodium ricotta or cottage cheese. Low-fat or nonfat yogurt. Low-fat, low-sodium cheese. Fats and oils Soft margarine without trans fats. Vegetable oil. Low-fat, reduced-fat, or light mayonnaise and salad dressings (reduced-sodium). Canola, safflower, olive, soybean, and sunflower oils. Avocado. Seasoning and other foods Herbs. Spices. Seasoning mixes without salt. Unsalted popcorn and pretzels. Fat-free sweets. What foods are not recommended? The items listed may not be a complete list. Talk with your dietitian about what dietary choices are best for you. Grains Baked goods made with  fat, such as croissants, muffins, or some breads. Dry pasta or rice meal packs. Vegetables Creamed or fried vegetables. Vegetables in a cheese sauce. Regular canned vegetables (not low-sodium or reduced-sodium). Regular canned tomato sauce and paste (not low-sodium or reduced-sodium). Regular tomato and vegetable juice (not low-sodium or reduced-sodium). Angie Fava. Olives. Fruits Canned fruit in a light or heavy syrup. Fried fruit. Fruit in cream or butter sauce. Meat and other protein foods Fatty cuts of meat. Ribs. Fried meat. Berniece Salines. Sausage. Bologna and other processed lunch meats. Salami. Fatback. Hotdogs. Bratwurst. Salted nuts and seeds. Canned beans with added salt. Canned or smoked fish. Whole eggs or egg yolks. Chicken or [place] with skin. Dairy Whole or 2% milk, cream, and half-and-half. Whole or full-fat cream cheese. Whole-fat or sweetened yogurt. Full-fat cheese. Nondairy creamers. Whipped toppings. Processed cheese and cheese spreads. Fats and oils Butter. Stick margarine. Lard. Shortening. Ghee. Bacon fat. Tropical oils, such as coconut, palm kernel, or palm oil. Seasoning and other  foods Salted popcorn and pretzels. Onion salt, garlic salt, seasoned salt, table salt, and sea salt. Worcestershire sauce. Tartar sauce. Barbecue sauce. Teriyaki sauce. Soy sauce, including reduced-sodium. Steak sauce. Canned and packaged gravies. Fish sauce. Oyster sauce. Cocktail sauce. Horseradish that you find on the shelf. Ketchup. Mustard. Meat flavorings and tenderizers. Bouillon cubes. Hot sauce and Tabasco sauce. Premade or packaged marinades. Premade or packaged taco seasonings. Relishes. Regular salad dressings. Where to find more information:  National Heart, Lung, and [place]: https://wilson-eaton.com/  American Heart Association: www.heart.org Summary  The DASH eating plan is a healthy eating plan that has been shown to reduce high blood pressure (hypertension). It may also reduce your risk for type 2 diabetes, heart disease, and stroke.  With the DASH eating plan, you should limit salt (sodium) intake to 2,300 mg a day. If you have hypertension, you may need to reduce your sodium intake to 1,500 mg a day.  When on the DASH eating plan, aim to eat more fresh fruits and vegetables, whole grains, lean proteins, low-fat dairy, and heart-healthy fats.  Work with your health care provider or diet and nutrition specialist (dietitian) to adjust your eating plan to your individual calorie needs. This information is not intended to replace advice given to you by your health care provider. Make sure you discuss any questions you have with your health care provider. Document Released: 01/30/2011 Document Revised: 01/23/2017 Document Reviewed: 02/04/2016 Elsevier Patient Education  2020 [employer].

## 2018-09-24 NOTE — Addendum Note (Signed)
Addended by: Westley Hummer B on: 09/24/2018 09:56 AM   Modules accepted: Orders

## 2018-09-24 NOTE — Addendum Note (Signed)
Addended by: Elmer Picker on: 09/24/2018 08:51 AM   Modules accepted: Orders

## 2018-09-25 LAB — HIV ANTIBODY (ROUTINE TESTING W REFLEX): HIV 1&2 Ab, 4th Generation: NONREACTIVE

## 2018-10-22 ENCOUNTER — Ambulatory Visit (INDEPENDENT_AMBULATORY_CARE_PROVIDER_SITE_OTHER): Payer: 59 | Admitting: Family Medicine

## 2018-10-22 ENCOUNTER — Ambulatory Visit: Payer: Self-pay

## 2018-10-22 ENCOUNTER — Encounter: Payer: Self-pay | Admitting: Family Medicine

## 2018-10-22 VITALS — BP 138/104 | HR 108 | Ht 72.0 in | Wt 333.0 lb

## 2018-10-22 DIAGNOSIS — M19079 Primary osteoarthritis, unspecified ankle and foot: Secondary | ICD-10-CM

## 2018-10-22 DIAGNOSIS — M79671 Pain in right foot: Secondary | ICD-10-CM

## 2018-10-22 MED ORDER — DOXYCYCLINE HYCLATE 100 MG PO TABS: 100.0000 mg | ORAL_TABLET | Freq: Two times a day (BID) | ORAL | 0 refills | Status: DC

## 2018-10-22 NOTE — Progress Notes (Signed)
[name] [address] [place], [place] [address] Phone: [phone] Subjective:   Mike [name], am serving as a scribe for Dr. Hulan Saas.  I'm seeing this patient by the request  of:  Isaac Bliss, Rayford Halsted, MD    CC: Foot pain follow-up  [id number]  Mike [name] is a 48 y.o. male coming in with complaint of right foot pain. Patient states pain over top of foot. Pain with walking. Pain has improved but is still there intermittently. Occurring for over one year. Does walk a lot for his job. Denies any numbness or tingling. Has used IBU and Aleve. Has tried going to podiatry.  Patient was given an injection at some point but did not notice any significant improvement.    Past Medical History:  Diagnosis Date  . Hypertension   . Morbid obesity ([place])    [name] past surgical history on file. Social History   Socioeconomic History  . Marital status: Married    Spouse name: Not on file  . Number of children: Not on file  . Years of education: Not on file  . Highest education level: Not on file  Occupational History  . Not on file  Social Needs  . Financial resource strain: Not on file  . Food insecurity    Worry: Not on file    Inability: Not on file  . Transportation needs    Medical: Not on file    Non-medical: Not on file  Tobacco Use  . Smoking status: Never Smoker  . Smokeless tobacco: Never Used  Substance and Sexual Activity  . Alcohol use: Yes    Alcohol/week: 21.0 standard drinks    Types: 21 Cans of beer per week    Frequency: Never  . Drug use: [name]  . Sexual activity: Yes  Lifestyle  . Physical activity    Days per week: Not on file    Minutes per session: Not on file  . Stress: Not on file  Relationships  . Social Herbalist on phone: Not on file    Gets together: Not on file    Attends religious service: Not on file    Active member of club or organization: Not on file    Attends meetings of  clubs or organizations: Not on file    Relationship status: Not on file  Other Topics Concern  . Not on file  Social History Narrative  . Not on file   [name] Known Allergies Family History  Problem Relation Age of Onset  . Hypertension Mother   . Diabetes Mother   . Hypertension Father   . Diabetes Father   . Diabetes Brother      Current Outpatient Medications (Cardiovascular):  .  lisinopril-hydrochlorothiazide (ZESTORETIC) 10-12.5 MG tablet, Take 1 tablet by mouth daily.     Current Outpatient Medications (Other):  Marland Kitchen  Bioflavonoid Products (BIOFLEX PO), Take 1 tablet by mouth daily. .  Multiple Vitamin (MULTIVITAMIN PO), Take 1 tablet by mouth daily. .  Omega-3 Fatty Acids (FISH OIL PO), Take 1 tablet by mouth daily. .  TURMERIC PO, Take 1 tablet by mouth daily. Marland Kitchen  doxycycline (VIBRA-TABS) 100 MG tablet, Take 1 tablet (100 mg total) by mouth 2 (two) times daily.    Past medical history, social, surgical and family history all reviewed in electronic medical record.  [name] pertanent information unless stated regarding to the chief complaint.   Review of Systems:  [name]  headache, visual changes, nausea, vomiting, diarrhea, constipation, dizziness, abdominal pain, skin rash, fevers, chills, night sweats, weight loss, swollen lymph nodes, body aches, joint swelling, chest pain, shortness of breath, mood changes.  Positive muscle aches  Objective  Blood pressure (!) 138/104, pulse (!) 108, height 6' (1.829 m), weight (!) 333 lb (151 kg), SpO2 95 %.    General: [name] apparent distress alert and oriented x3 mood and affect normal, dressed appropriately.  HEENT: Pupils equal, extraocular movements intact  Respiratory: Patient's speak in full sentences and does not appear short of breath  Cardiovascular: Trace lower extremity edema, non tender, [name] erythema  Skin: Warm dry intact with [name] signs of infection or rash on extremities or on axial skeleton.  Abdomen: Soft nontender morbidly obese  Neuro: Cranial nerves II through XII are intact, neurovascularly intact in all extremities with 2+ DTRs and 2+ pulses.  Lymph: [name] lymphadenopathy of posterior or anterior cervical chain or axillae bilaterally.  Gait antalgic MSK: Trace tender with full range of motion and good stability and symmetric strength and tone of shoulders, elbows, wrist, hip, knee and ankles bilaterally.  Patient's foot exam shows the patient does have some overpronation of the hindfoot and midfoot.  Patient does have breakdown with rigid midfoot.  Tender to palpation of the dorsal aspect of the fourth and fifth metatarsals.  Mild swelling in this area.  Limited musculoskeletal ultrasound was performed and interpreted by Lyndal Pulley  Limited ultrasound of patient's foot shows the patient does have some osteoarthritic changes of the midfoot.  Patient also has some soft tissue swelling that is consistent with a very low cellulitis.  Increasing in Doppler flow to the joint space.  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 The above documentation has been reviewed and is accurate and complete Lyndal Pulley, DO       Note: This dictation was prepared with Dragon dictation along with smaller phrase technology. Any transcriptional errors that result from this process are unintentional.

## 2018-10-22 NOTE — Patient Instructions (Addendum)
Good to see you.  Ice 20 minutes 2 times daily. Usually after activity and before bed. Exercises 3 times a week.  Tart cherry extract 1200mg  at night Doxy 7 days Recovery sandals Rocker bottom shoes [employer] Orthotics Total Support Biking or elliptical for cardio Vitamin D 4000 IU daily  See me again in 4 weeks

## 2018-10-22 NOTE — Assessment & Plan Note (Signed)
Midfoot arthritis, increase vitamin D, discussed rocker-bottom shoes, discussed over-the-counter orthotics, discussed weight loss, nonweightbearing, follow-up again in 4 to 8 weeks

## 2018-11-19 ENCOUNTER — Ambulatory Visit: Payer: 59 | Admitting: Internal Medicine

## 2018-11-19 ENCOUNTER — Encounter: Payer: Self-pay | Admitting: Family Medicine

## 2018-11-19 ENCOUNTER — Ambulatory Visit: Payer: Self-pay

## 2018-11-19 ENCOUNTER — Ambulatory Visit (INDEPENDENT_AMBULATORY_CARE_PROVIDER_SITE_OTHER): Payer: 59 | Admitting: Family Medicine

## 2018-11-19 ENCOUNTER — Telehealth: Payer: Self-pay

## 2018-11-19 ENCOUNTER — Other Ambulatory Visit: Payer: Self-pay

## 2018-11-19 VITALS — BP 128/94 | HR 110 | Ht 72.0 in | Wt 355.0 lb

## 2018-11-19 DIAGNOSIS — M79671 Pain in right foot: Secondary | ICD-10-CM | POA: Diagnosis not present

## 2018-11-19 DIAGNOSIS — M19079 Primary osteoarthritis, unspecified ankle and foot: Secondary | ICD-10-CM | POA: Diagnosis not present

## 2018-11-19 NOTE — Patient Instructions (Signed)
Good to see you  You are doing much better  We will get you insoles and I think it will do well  Ice at the end of the day We will call you

## 2018-11-19 NOTE — Progress Notes (Signed)
[name] [address], [place] [address] Phone: [phone] Subjective:   :    CC: Right foot pain follow-up  [id number]   10/22/2018 Midfoot arthritis, increase vitamin D, discussed rocker-bottom shoes, discussed over-the-counter orthotics, discussed weight loss, nonweightbearing, follow-up again in 4 to 8 weeks  Update 11/19/2018 Mike [name] is a 48 y.o. male coming in with complaint of right foot pain. Took antibiotic for one week. Feels like his pain has improved. Feels like his pain has subsided. Not using orthotics or shoes. Is using vit d3.  Patient states that he is feeling 90% better.  Patient senses unresponsible.  Making progress overall.  Wants to increase activity and is somewhat concerned secondary to the amount of pain.     Past Medical History:  Diagnosis Date  . Hypertension   . Morbid obesity ([place])    No past surgical history on file. Social History   Socioeconomic History  . Marital status: Married    Spouse name: Not on file  . Number of children: Not on file  . Years of education: Not on file  . Highest education level: Not on file  Occupational History  . Not on file  Social Needs  . Financial resource strain: Not on file  . Food insecurity    Worry: Not on file    Inability: Not on file  . Transportation needs    Medical: Not on file    Non-medical: Not on file  Tobacco Use  . Smoking status: Never Smoker  . Smokeless tobacco: Never Used  Substance and Sexual Activity  . Alcohol use: Yes    Alcohol/week: 21.0 standard drinks    Types: 21 Cans of beer per week    Frequency: Never  . Drug use: No  . Sexual activity: Yes  Lifestyle  . Physical activity    Days per week: Not on file    Minutes per session: Not on file  . Stress: Not on file  Relationships  . Social Herbalist on phone: Not on file    Gets together: Not on file    Attends religious service: Not on file    Active  member of club or organization: Not on file    Attends meetings of clubs or organizations: Not on file    Relationship status: Not on file  Other Topics Concern  . Not on file  Social History Narrative  . Not on file   No Known Allergies Family History  Problem Relation Age of Onset  . Hypertension Mother   . Diabetes Mother   . Hypertension Father   . Diabetes Father   . Diabetes Brother      Current Outpatient Medications (Cardiovascular):  .  lisinopril-hydrochlorothiazide (ZESTORETIC) 10-12.5 MG tablet, Take 1 tablet by mouth daily.     Current Outpatient Medications (Other):  Marland Kitchen  Bioflavonoid Products (BIOFLEX PO), Take 1 tablet by mouth daily. Marland Kitchen  doxycycline (VIBRA-TABS) 100 MG tablet, Take 1 tablet (100 mg total) by mouth 2 (two) times daily. .  Multiple Vitamin (MULTIVITAMIN PO), Take 1 tablet by mouth daily. .  Omega-3 Fatty Acids (FISH OIL PO), Take 1 tablet by mouth daily. .  TURMERIC PO, Take 1 tablet by mouth daily.    Past medical history, social, surgical and family history all reviewed in electronic medical record.  No pertanent information unless stated regarding to the chief complaint.   Review of Systems:  No headache,  visual changes, nausea, vomiting, diarrhea, constipation, dizziness, abdominal pain, skin rash, fevers, chills, night sweats, weight loss, swollen lymph nodes, body aches, joint swelling, muscle aches, chest pain, shortness of breath, mood changes.   Objective  Blood pressure (!) 128/94, pulse (!) 110, height 6' (1.829 m), weight (!) 355 lb (161 kg), SpO2 97 %.    General: No apparent distress alert and oriented x3 mood and affect normal, dressed appropriately.  HEENT: Pupils equal, extraocular movements intact  Respiratory: Patient's speak in full sentences and does not appear short of breath  Cardiovascular: No lower extremity edema, non tender, no erythema  Skin: Warm dry intact with no signs of infection or rash on extremities or on  axial skeleton.  Abdomen: Soft nontender  Neuro: Cranial nerves II through XII are intact, neurovascularly intact in all extremities with 2+ DTRs and 2+ pulses.  Lymph: No lymphadenopathy of posterior or anterior cervical chain or axillae bilaterally.  Gait normal with good balance and coordination.  MSK:  tender with full range of motion and good stability and symmetric strength and tone of shoulders, elbows, wrist, hip, knee and ankles bilaterally.  Right foot pain does have some breakdown the longitudinal arch that is fairly severe.  Mild tenderness of the midfoot.  Rigid midfoot noted.  Mild splaying between the first and second toes.  Limited musculoskeletal ultrasound was performed and interpreted by Lyndal Pulley  Limited ultrasound of patient's foot shows that there is still some mild irregularities of the vasculature but no masses noted.  Patient's cortical defects no longer seen but does have moderate to severe arthritic changes of the midfoot.  No significant soft tissue swelling from previous exam. Impression: Midfoot arthritis   Impression and Recommendations:      The above documentation has been reviewed and is accurate and complete Lyndal Pulley, DO       Note: This dictation was prepared with Dragon dictation along with smaller phrase technology. Any transcriptional errors that result from this process are unintentional.

## 2018-11-19 NOTE — Assessment & Plan Note (Signed)
Midfoot arthritis, due to patient's foot size though we will see if we can do custom orthotics but if not patient will need over-the-counter orthotics.  We discussed which activities of doing which wants to avoid.  Patient is to increase activity slowly over the course of next several weeks.

## 2018-11-19 NOTE — Telephone Encounter (Signed)
Called patient to inform him that we do not have orthotics in his size and that he should try spenco orthotics total support.

## 2018-11-26 ENCOUNTER — Ambulatory Visit: Payer: Self-pay | Admitting: Internal Medicine

## 2018-11-26 DIAGNOSIS — Z0289 Encounter for other administrative examinations: Secondary | ICD-10-CM

## 2018-12-04 ENCOUNTER — Other Ambulatory Visit: Payer: Self-pay | Admitting: Internal Medicine

## 2018-12-04 DIAGNOSIS — I10 Essential (primary) hypertension: Secondary | ICD-10-CM

## 2019-01-22 ENCOUNTER — Other Ambulatory Visit: Payer: Self-pay | Admitting: Family Medicine

## 2019-01-27 ENCOUNTER — Other Ambulatory Visit: Payer: Self-pay

## 2019-01-28 ENCOUNTER — Ambulatory Visit (INDEPENDENT_AMBULATORY_CARE_PROVIDER_SITE_OTHER): Payer: 59 | Admitting: Internal Medicine

## 2019-01-28 ENCOUNTER — Encounter: Payer: Self-pay | Admitting: Internal Medicine

## 2019-01-28 VITALS — BP 180/110 | HR 106 | Temp 97.3°F | Wt 353.4 lb

## 2019-01-28 DIAGNOSIS — I10 Essential (primary) hypertension: Secondary | ICD-10-CM | POA: Diagnosis not present

## 2019-01-28 MED ORDER — LISINOPRIL-HYDROCHLOROTHIAZIDE 10-12.5 MG PO TABS: 1.0000 | ORAL_TABLET | Freq: Every day | ORAL | 1 refills | Status: DC

## 2019-01-28 NOTE — Patient Instructions (Signed)
-Nice seeing you today!!  -Make sure you are consistent in taking your blood pressure medication every morning.  -Take your blood pressure at home 2-3 times a week and bring those numbers into your next visit.  -Schedule follow up in 6 weeks.  -Low salt diet (see below).   DASH Eating Plan DASH stands for "Dietary Approaches to Stop Hypertension." The DASH eating plan is a healthy eating plan that has been shown to reduce high blood pressure (hypertension). It may also reduce your risk for type 2 diabetes, heart disease, and stroke. The DASH eating plan may also help with weight loss. What are tips for following this plan?  General guidelines  Avoid eating more than 2,300 mg (milligrams) of salt (sodium) a day. If you have hypertension, you may need to reduce your sodium intake to 1,500 mg a day.  Limit alcohol intake to no more than 1 drink a day for nonpregnant women and 2 drinks a day for men. One drink equals 12 oz of beer, 5 oz of wine, or 1 oz of hard liquor.  Work with your health care provider to maintain a healthy body weight or to lose weight. Ask what an ideal weight is for you.  Get at least 30 minutes of exercise that causes your heart to beat faster (aerobic exercise) most days of the week. Activities may include walking, swimming, or biking.  Work with your health care provider or diet and nutrition specialist (dietitian) to adjust your eating plan to your individual calorie needs. Reading food labels   Check food labels for the amount of sodium per serving. Choose foods with less than 5 percent of the Daily Value of sodium. Generally, foods with less than 300 mg of sodium per serving fit into this eating plan.  To find whole grains, look for the word "whole" as the first word in the ingredient list. Shopping  Buy products labeled as "low-sodium" or "no salt added."  Buy fresh foods. Avoid canned foods and premade or frozen meals. Cooking  Avoid adding salt when  cooking. Use salt-free seasonings or herbs instead of table salt or sea salt. Check with your health care provider or pharmacist before using salt substitutes.  Do not fry foods. Cook foods using healthy methods such as baking, boiling, grilling, and broiling instead.  Cook with heart-healthy oils, such as olive, canola, soybean, or sunflower oil. Meal planning  Eat a balanced diet that includes: ? 5 or more servings of fruits and vegetables each day. At each meal, try to fill half of your plate with fruits and vegetables. ? Up to 6-8 servings of whole grains each day. ? Less than 6 oz of lean meat, poultry, or fish each day. A 3-oz serving of meat is about the same size as a deck of cards. One egg equals 1 oz. ? 2 servings of low-fat dairy each day. ? A serving of nuts, seeds, or beans 5 times each week. ? Heart-healthy fats. Healthy fats called Omega-3 fatty acids are found in foods such as flaxseeds and coldwater fish, like sardines, salmon, and mackerel.  Limit how much you eat of the following: ? Canned or prepackaged foods. ? Food that is high in trans fat, such as fried foods. ? Food that is high in saturated fat, such as fatty meat. ? Sweets, desserts, sugary drinks, and other foods with added sugar. ? Full-fat dairy products.  Do not salt foods before eating.  Try to eat at least 2 vegetarian meals  each week.  Eat more home-cooked food and less restaurant, buffet, and fast food.  When eating at a restaurant, ask that your food be prepared with less salt or no salt, if possible. What foods are recommended? The items listed may not be a complete list. Talk with your dietitian about what dietary choices are best for you. Grains Whole-grain or whole-wheat bread. Whole-grain or whole-wheat pasta. Brown rice. [place]. Bulgur. Whole-grain and low-sodium cereals. Pita bread. Low-fat, low-sodium crackers. Whole-wheat flour tortillas. Vegetables Fresh or frozen vegetables  (raw, steamed, roasted, or grilled). Low-sodium or reduced-sodium tomato and vegetable juice. Low-sodium or reduced-sodium tomato sauce and tomato paste. Low-sodium or reduced-sodium canned vegetables. Fruits All fresh, dried, or frozen fruit. Canned fruit in natural juice (without added sugar). Meat and other protein foods Skinless chicken or [place]. Ground chicken or [place]. Pork with fat trimmed off. Fish and seafood. Egg whites. Dried beans, peas, or lentils. Unsalted nuts, nut butters, and seeds. Unsalted canned beans. Lean cuts of beef with fat trimmed off. Low-sodium, lean deli meat. Dairy Low-fat (1%) or fat-free (skim) milk. Fat-free, low-fat, or reduced-fat cheeses. Nonfat, low-sodium ricotta or cottage cheese. Low-fat or nonfat yogurt. Low-fat, low-sodium cheese. Fats and oils Soft margarine without trans fats. Vegetable oil. Low-fat, reduced-fat, or light mayonnaise and salad dressings (reduced-sodium). Canola, safflower, olive, soybean, and sunflower oils. Avocado. Seasoning and other foods Herbs. Spices. Seasoning mixes without salt. Unsalted popcorn and pretzels. Fat-free sweets. What foods are not recommended? The items listed may not be a complete list. Talk with your dietitian about what dietary choices are best for you. Grains Baked goods made with fat, such as croissants, muffins, or some breads. Dry pasta or rice meal packs. Vegetables Creamed or fried vegetables. Vegetables in a cheese sauce. Regular canned vegetables (not low-sodium or reduced-sodium). Regular canned tomato sauce and paste (not low-sodium or reduced-sodium). Regular tomato and vegetable juice (not low-sodium or reduced-sodium). Angie Fava. Olives. Fruits Canned fruit in a light or heavy syrup. Fried fruit. Fruit in cream or butter sauce. Meat and other protein foods Fatty cuts of meat. Ribs. Fried meat. Berniece Salines. Sausage. Bologna and other processed lunch meats. Salami. Fatback. Hotdogs. Bratwurst. Salted nuts  and seeds. Canned beans with added salt. Canned or smoked fish. Whole eggs or egg yolks. Chicken or [place] with skin. Dairy Whole or 2% milk, cream, and half-and-half. Whole or full-fat cream cheese. Whole-fat or sweetened yogurt. Full-fat cheese. Nondairy creamers. Whipped toppings. Processed cheese and cheese spreads. Fats and oils Butter. Stick margarine. Lard. Shortening. Ghee. Bacon fat. Tropical oils, such as coconut, palm kernel, or palm oil. Seasoning and other foods Salted popcorn and pretzels. Onion salt, garlic salt, seasoned salt, table salt, and sea salt. Worcestershire sauce. Tartar sauce. Barbecue sauce. Teriyaki sauce. Soy sauce, including reduced-sodium. Steak sauce. Canned and packaged gravies. Fish sauce. Oyster sauce. Cocktail sauce. Horseradish that you find on the shelf. Ketchup. Mustard. Meat flavorings and tenderizers. Bouillon cubes. Hot sauce and Tabasco sauce. Premade or packaged marinades. Premade or packaged taco seasonings. Relishes. Regular salad dressings. Where to find more information:  National Heart, Lung, and [place]: https://wilson-eaton.com/  American Heart Association: www.heart.org Summary  The DASH eating plan is a healthy eating plan that has been shown to reduce high blood pressure (hypertension). It may also reduce your risk for type 2 diabetes, heart disease, and stroke.  With the DASH eating plan, you should limit salt (sodium) intake to 2,300 mg a day. If you have hypertension, you may need to reduce  your sodium intake to 1,500 mg a day.  When on the DASH eating plan, aim to eat more fresh fruits and vegetables, whole grains, lean proteins, low-fat dairy, and heart-healthy fats.  Work with your health care provider or diet and nutrition specialist (dietitian) to adjust your eating plan to your individual calorie needs. This information is not intended to replace advice given to you by your health care provider. Make sure you discuss any questions  you have with your health care provider. Document Released: 01/30/2011 Document Revised: 01/23/2017 Document Reviewed: 02/04/2016 Elsevier Patient Education  2020 [employer].

## 2019-01-28 NOTE — Progress Notes (Signed)
Established Patient Office Visit     This visit occurred during the SARS-CoV-2 public health emergency.  Safety protocols were in place, including screening questions prior to the visit, additional usage of staff PPE, and extensive cleaning of exam room while observing appropriate contact time as indicated for disinfecting solutions.    CC/Reason for Visit: Blood pressure follow-up  HPI: Mike [name] is a 48 y.o. male who is coming in today for the above mentioned reasons. Past Medical History is significant for: Hypertension, morbid obesity.  His hypertension has not been well controlled.  At last visit it was noted to be 140/100 but he did not want to increase his medication so I gave him a 6-week grace period to come back with ambulatory blood pressure monitoring.  Unfortunately he ran out of his blood pressure medication 1 week ago, and did not think to call us for refill before his follow-up appointment.  He has not been eating low in salt.  His blood pressure in office today is 180/100.  He denies chest pain, shortness of breath, headache, blurry or double vision or any focal neurologic deficits.   Past Medical/Surgical History: Past Medical History:  Diagnosis Date  . Hypertension   . Morbid obesity ([place])     No past surgical history on file.  Social History:  reports that he has never smoked. He has never used smokeless tobacco. He reports current alcohol use of about 21.0 standard drinks of alcohol per week. He reports that he does not use drugs.  Allergies: No Known Allergies  Family History:  Family History  Problem Relation Age of Onset  . Hypertension Mother   . Diabetes Mother   . Hypertension Father   . Diabetes Father   . Diabetes Brother      Current Outpatient Medications:  .  Bioflavonoid Products (BIOFLEX PO), Take 1 tablet by mouth daily., Disp: , Rfl:  .  doxycycline (VIBRA-TABS) 100 MG tablet, Take 1 tablet (100 mg total) by mouth 2 (two)  times daily., Disp: 42 tablet, Rfl: 0 .  lisinopril-hydrochlorothiazide (ZESTORETIC) 10-12.5 MG tablet, Take 1 tablet by mouth daily., Disp: 90 tablet, Rfl: 1 .  Multiple Vitamin (MULTIVITAMIN PO), Take 1 tablet by mouth daily., Disp: , Rfl:  .  Omega-3 Fatty Acids (FISH OIL PO), Take 1 tablet by mouth daily., Disp: , Rfl:  .  TURMERIC PO, Take 1 tablet by mouth daily., Disp: , Rfl:   Review of Systems:  Constitutional: Denies fever, chills, diaphoresis, appetite change and fatigue.  HEENT: Denies photophobia, eye pain, redness, hearing loss, ear pain, congestion, sore throat, rhinorrhea, sneezing, mouth sores, trouble swallowing, neck pain, neck stiffness and tinnitus.   Respiratory: Denies SOB, DOE, cough, chest tightness,  and wheezing.   Cardiovascular: Denies chest pain, palpitations and leg swelling.  Gastrointestinal: Denies nausea, vomiting, abdominal pain, diarrhea, constipation, blood in stool and abdominal distention.  Genitourinary: Denies dysuria, urgency, frequency, hematuria, flank pain and difficulty urinating.  Endocrine: Denies: hot or cold intolerance, sweats, changes in hair or nails, polyuria, polydipsia. Musculoskeletal: Denies myalgias, back pain, joint swelling, arthralgias and gait problem.  Skin: Denies pallor, rash and wound.  Neurological: Denies dizziness, seizures, syncope, weakness, light-headedness, numbness and headaches.  Hematological: Denies adenopathy. Easy bruising, personal or family bleeding history  Psychiatric/Behavioral: Denies suicidal ideation, mood changes, confusion, nervousness, sleep disturbance and agitation    Physical Exam: Vitals:   01/28/19 0855  BP: (!) 180/110  Pulse: (!) 106  Temp: (!)  97.3 F (36.3 C)  TempSrc: Temporal  SpO2: 98%  Weight: (!) 353 lb 6.4 oz (160.3 kg)    Body mass index is 47.93 kg/m.   Constitutional: NAD, calm, comfortable Eyes: PERRL, lids and conjunctivae normal ENMT: Mucous membranes are moist.   Respiratory: clear to auscultation bilaterally, no wheezing, no crackles. Normal respiratory effort. No accessory muscle use.  Cardiovascular: Regular rate and rhythm, no murmurs / rubs / gallops. No extremity edema. 2+ pedal pulses.  Neurologic: Grossly intact and nonfocal Psychiatric: Normal judgment and insight. Alert and oriented x 3. Normal mood.    Impression and Plan:  Essential hypertension  -Will refill his lisinopril/HCT today. -He will do ambulatory blood pressure monitoring and return in 6 weeks for follow-up.   Patient Instructions  -Nice seeing you today!!  -Make sure you are consistent in taking your blood pressure medication every morning.  -Take your blood pressure at home 2-3 times a week and bring those numbers into your next visit.  -Schedule follow up in 6 weeks.  -Low salt diet (see below).   DASH Eating Plan DASH stands for "Dietary Approaches to Stop Hypertension." The DASH eating plan is a healthy eating plan that has been shown to reduce high blood pressure (hypertension). It may also reduce your risk for type 2 diabetes, heart disease, and stroke. The DASH eating plan may also help with weight loss. What are tips for following this plan?  General guidelines  Avoid eating more than 2,300 mg (milligrams) of salt (sodium) a day. If you have hypertension, you may need to reduce your sodium intake to 1,500 mg a day.  Limit alcohol intake to no more than 1 drink a day for nonpregnant women and 2 drinks a day for men. One drink equals 12 oz of beer, 5 oz of wine, or 1 oz of hard liquor.  Work with your health care provider to maintain a healthy body weight or to lose weight. Ask what an ideal weight is for you.  Get at least 30 minutes of exercise that causes your heart to beat faster (aerobic exercise) most days of the week. Activities may include walking, swimming, or biking.  Work with your health care provider or diet and nutrition specialist  (dietitian) to adjust your eating plan to your individual calorie needs. Reading food labels   Check food labels for the amount of sodium per serving. Choose foods with less than 5 percent of the Daily Value of sodium. Generally, foods with less than 300 mg of sodium per serving fit into this eating plan.  To find whole grains, look for the word "whole" as the first word in the ingredient list. Shopping  Buy products labeled as "low-sodium" or "no salt added."  Buy fresh foods. Avoid canned foods and premade or frozen meals. Cooking  Avoid adding salt when cooking. Use salt-free seasonings or herbs instead of table salt or sea salt. Check with your health care provider or pharmacist before using salt substitutes.  Do not fry foods. Cook foods using healthy methods such as baking, boiling, grilling, and broiling instead.  Cook with heart-healthy oils, such as olive, canola, soybean, or sunflower oil. Meal planning  Eat a balanced diet that includes: ? 5 or more servings of fruits and vegetables each day. At each meal, try to fill half of your plate with fruits and vegetables. ? Up to 6-8 servings of whole grains each day. ? Less than 6 oz of lean meat, poultry, or fish each day.  A 3-oz serving of meat is about the same size as a deck of cards. One egg equals 1 oz. ? 2 servings of low-fat dairy each day. ? A serving of nuts, seeds, or beans 5 times each week. ? Heart-healthy fats. Healthy fats called Omega-3 fatty acids are found in foods such as flaxseeds and coldwater fish, like sardines, salmon, and mackerel.  Limit how much you eat of the following: ? Canned or prepackaged foods. ? Food that is high in trans fat, such as fried foods. ? Food that is high in saturated fat, such as fatty meat. ? Sweets, desserts, sugary drinks, and other foods with added sugar. ? Full-fat dairy products.  Do not salt foods before eating.  Try to eat at least 2 vegetarian meals each week.  Eat  more home-cooked food and less restaurant, buffet, and fast food.  When eating at a restaurant, ask that your food be prepared with less salt or no salt, if possible. What foods are recommended? The items listed may not be a complete list. Talk with your dietitian about what dietary choices are best for you. Grains Whole-grain or whole-wheat bread. Whole-grain or whole-wheat pasta. Brown rice. [place]. Bulgur. Whole-grain and low-sodium cereals. Pita bread. Low-fat, low-sodium crackers. Whole-wheat flour tortillas. Vegetables Fresh or frozen vegetables (raw, steamed, roasted, or grilled). Low-sodium or reduced-sodium tomato and vegetable juice. Low-sodium or reduced-sodium tomato sauce and tomato paste. Low-sodium or reduced-sodium canned vegetables. Fruits All fresh, dried, or frozen fruit. Canned fruit in natural juice (without added sugar). Meat and other protein foods Skinless chicken or [place]. Ground chicken or [place]. Pork with fat trimmed off. Fish and seafood. Egg whites. Dried beans, peas, or lentils. Unsalted nuts, nut butters, and seeds. Unsalted canned beans. Lean cuts of beef with fat trimmed off. Low-sodium, lean deli meat. Dairy Low-fat (1%) or fat-free (skim) milk. Fat-free, low-fat, or reduced-fat cheeses. Nonfat, low-sodium ricotta or cottage cheese. Low-fat or nonfat yogurt. Low-fat, low-sodium cheese. Fats and oils Soft margarine without trans fats. Vegetable oil. Low-fat, reduced-fat, or light mayonnaise and salad dressings (reduced-sodium). Canola, safflower, olive, soybean, and sunflower oils. Avocado. Seasoning and other foods Herbs. Spices. Seasoning mixes without salt. Unsalted popcorn and pretzels. Fat-free sweets. What foods are not recommended? The items listed may not be a complete list. Talk with your dietitian about what dietary choices are best for you. Grains Baked goods made with fat, such as croissants, muffins, or some breads. Dry pasta or rice meal  packs. Vegetables Creamed or fried vegetables. Vegetables in a cheese sauce. Regular canned vegetables (not low-sodium or reduced-sodium). Regular canned tomato sauce and paste (not low-sodium or reduced-sodium). Regular tomato and vegetable juice (not low-sodium or reduced-sodium). Angie Fava. Olives. Fruits Canned fruit in a light or heavy syrup. Fried fruit. Fruit in cream or butter sauce. Meat and other protein foods Fatty cuts of meat. Ribs. Fried meat. Berniece Salines. Sausage. Bologna and other processed lunch meats. Salami. Fatback. Hotdogs. Bratwurst. Salted nuts and seeds. Canned beans with added salt. Canned or smoked fish. Whole eggs or egg yolks. Chicken or [place] with skin. Dairy Whole or 2% milk, cream, and half-and-half. Whole or full-fat cream cheese. Whole-fat or sweetened yogurt. Full-fat cheese. Nondairy creamers. Whipped toppings. Processed cheese and cheese spreads. Fats and oils Butter. Stick margarine. Lard. Shortening. Ghee. Bacon fat. Tropical oils, such as coconut, palm kernel, or palm oil. Seasoning and other foods Salted popcorn and pretzels. Onion salt, garlic salt, seasoned salt, table salt, and sea salt. Worcestershire sauce. Tartar  sauce. Barbecue sauce. Teriyaki sauce. Soy sauce, including reduced-sodium. Steak sauce. Canned and packaged gravies. Fish sauce. Oyster sauce. Cocktail sauce. Horseradish that you find on the shelf. Ketchup. Mustard. Meat flavorings and tenderizers. Bouillon cubes. Hot sauce and Tabasco sauce. Premade or packaged marinades. Premade or packaged taco seasonings. Relishes. Regular salad dressings. Where to find more information:  National Heart, Lung, and [place]: https://wilson-eaton.com/  American Heart Association: www.heart.org Summary  The DASH eating plan is a healthy eating plan that has been shown to reduce high blood pressure (hypertension). It may also reduce your risk for type 2 diabetes, heart disease, and stroke.  With the DASH eating  plan, you should limit salt (sodium) intake to 2,300 mg a day. If you have hypertension, you may need to reduce your sodium intake to 1,500 mg a day.  When on the DASH eating plan, aim to eat more fresh fruits and vegetables, whole grains, lean proteins, low-fat dairy, and heart-healthy fats.  Work with your health care provider or diet and nutrition specialist (dietitian) to adjust your eating plan to your individual calorie needs. This information is not intended to replace advice given to you by your health care provider. Make sure you discuss any questions you have with your health care provider. Document Released: 01/30/2011 Document Revised: 01/23/2017 Document Reviewed: 02/04/2016 Elsevier Patient Education  2020 [place], [place] [facility]

## 2019-03-11 ENCOUNTER — Other Ambulatory Visit: Payer: Self-pay

## 2019-03-11 ENCOUNTER — Encounter: Payer: Self-pay | Admitting: Internal Medicine

## 2019-03-11 ENCOUNTER — Ambulatory Visit (INDEPENDENT_AMBULATORY_CARE_PROVIDER_SITE_OTHER): Payer: 59 | Admitting: Internal Medicine

## 2019-03-11 VITALS — BP 120/90 | HR 107 | Temp 97.1°F | Wt 349.4 lb

## 2019-03-11 DIAGNOSIS — Z23 Encounter for immunization: Secondary | ICD-10-CM | POA: Diagnosis not present

## 2019-03-11 DIAGNOSIS — I10 Essential (primary) hypertension: Secondary | ICD-10-CM | POA: Diagnosis not present

## 2019-03-11 NOTE — Patient Instructions (Signed)
-Nice seeing you today!!  -Keep up the good work!  -Flu vaccine today.  -Continue to look at your daily salt intake (see below).  -Schedule a 3 month follow up.   DASH Eating Plan DASH stands for "Dietary Approaches to Stop Hypertension." The DASH eating plan is a healthy eating plan that has been shown to reduce high blood pressure (hypertension). It may also reduce your risk for type 2 diabetes, heart disease, and stroke. The DASH eating plan may also help with weight loss. What are tips for following this plan?  General guidelines  Avoid eating more than 2,300 mg (milligrams) of salt (sodium) a day. If you have hypertension, you may need to reduce your sodium intake to 1,500 mg a day.  Limit alcohol intake to no more than 1 drink a day for nonpregnant women and 2 drinks a day for men. One drink equals 12 oz of beer, 5 oz of wine, or 1 oz of hard liquor.  Work with your health care provider to maintain a healthy body weight or to lose weight. Ask what an ideal weight is for you.  Get at least 30 minutes of exercise that causes your heart to beat faster (aerobic exercise) most days of the week. Activities may include walking, swimming, or biking.  Work with your health care provider or diet and nutrition specialist (dietitian) to adjust your eating plan to your individual calorie needs. Reading food labels   Check food labels for the amount of sodium per serving. Choose foods with less than 5 percent of the Daily Value of sodium. Generally, foods with less than 300 mg of sodium per serving fit into this eating plan.  To find whole grains, look for the word "whole" as the first word in the ingredient list. Shopping  Buy products labeled as "low-sodium" or "no salt added."  Buy fresh foods. Avoid canned foods and premade or frozen meals. Cooking  Avoid adding salt when cooking. Use salt-free seasonings or herbs instead of table salt or sea salt. Check with your health care  provider or pharmacist before using salt substitutes.  Do not fry foods. Cook foods using healthy methods such as baking, boiling, grilling, and broiling instead.  Cook with heart-healthy oils, such as olive, canola, soybean, or sunflower oil. Meal planning  Eat a balanced diet that includes: ? 5 or more servings of fruits and vegetables each day. At each meal, try to fill half of your plate with fruits and vegetables. ? Up to 6-8 servings of whole grains each day. ? Less than 6 oz of lean meat, poultry, or fish each day. A 3-oz serving of meat is about the same size as a deck of cards. One egg equals 1 oz. ? 2 servings of low-fat dairy each day. ? A serving of nuts, seeds, or beans 5 times each week. ? Heart-healthy fats. Healthy fats called Omega-3 fatty acids are found in foods such as flaxseeds and coldwater fish, like sardines, salmon, and mackerel.  Limit how much you eat of the following: ? Canned or prepackaged foods. ? Food that is high in trans fat, such as fried foods. ? Food that is high in saturated fat, such as fatty meat. ? Sweets, desserts, sugary drinks, and other foods with added sugar. ? Full-fat dairy products.  Do not salt foods before eating.  Try to eat at least 2 vegetarian meals each week.  Eat more home-cooked food and less restaurant, buffet, and fast food.  When eating at  a restaurant, ask that your food be prepared with less salt or no salt, if possible. What foods are recommended? The items listed may not be a complete list. Talk with your dietitian about what dietary choices are best for you. Grains Whole-grain or whole-wheat bread. Whole-grain or whole-wheat pasta. Brown rice. [place]. Bulgur. Whole-grain and low-sodium cereals. Pita bread. Low-fat, low-sodium crackers. Whole-wheat flour tortillas. Vegetables Fresh or frozen vegetables (raw, steamed, roasted, or grilled). Low-sodium or reduced-sodium tomato and vegetable juice. Low-sodium or  reduced-sodium tomato sauce and tomato paste. Low-sodium or reduced-sodium canned vegetables. Fruits All fresh, dried, or frozen fruit. Canned fruit in natural juice (without added sugar). Meat and other protein foods Skinless chicken or [place]. Ground chicken or [place]. Pork with fat trimmed off. Fish and seafood. Egg whites. Dried beans, peas, or lentils. Unsalted nuts, nut butters, and seeds. Unsalted canned beans. Lean cuts of beef with fat trimmed off. Low-sodium, lean deli meat. Dairy Low-fat (1%) or fat-free (skim) milk. Fat-free, low-fat, or reduced-fat cheeses. Nonfat, low-sodium ricotta or cottage cheese. Low-fat or nonfat yogurt. Low-fat, low-sodium cheese. Fats and oils Soft margarine without trans fats. Vegetable oil. Low-fat, reduced-fat, or light mayonnaise and salad dressings (reduced-sodium). Canola, safflower, olive, soybean, and sunflower oils. Avocado. Seasoning and other foods Herbs. Spices. Seasoning mixes without salt. Unsalted popcorn and pretzels. Fat-free sweets. What foods are not recommended? The items listed may not be a complete list. Talk with your dietitian about what dietary choices are best for you. Grains Baked goods made with fat, such as croissants, muffins, or some breads. Dry pasta or rice meal packs. Vegetables Creamed or fried vegetables. Vegetables in a cheese sauce. Regular canned vegetables (not low-sodium or reduced-sodium). Regular canned tomato sauce and paste (not low-sodium or reduced-sodium). Regular tomato and vegetable juice (not low-sodium or reduced-sodium). Angie Fava. Olives. Fruits Canned fruit in a light or heavy syrup. Fried fruit. Fruit in cream or butter sauce. Meat and other protein foods Fatty cuts of meat. Ribs. Fried meat. Berniece Salines. Sausage. Bologna and other processed lunch meats. Salami. Fatback. Hotdogs. Bratwurst. Salted nuts and seeds. Canned beans with added salt. Canned or smoked fish. Whole eggs or egg yolks. Chicken or [place]  with skin. Dairy Whole or 2% milk, cream, and half-and-half. Whole or full-fat cream cheese. Whole-fat or sweetened yogurt. Full-fat cheese. Nondairy creamers. Whipped toppings. Processed cheese and cheese spreads. Fats and oils Butter. Stick margarine. Lard. Shortening. Ghee. Bacon fat. Tropical oils, such as coconut, palm kernel, or palm oil. Seasoning and other foods Salted popcorn and pretzels. Onion salt, garlic salt, seasoned salt, table salt, and sea salt. Worcestershire sauce. Tartar sauce. Barbecue sauce. Teriyaki sauce. Soy sauce, including reduced-sodium. Steak sauce. Canned and packaged gravies. Fish sauce. Oyster sauce. Cocktail sauce. Horseradish that you find on the shelf. Ketchup. Mustard. Meat flavorings and tenderizers. Bouillon cubes. Hot sauce and Tabasco sauce. Premade or packaged marinades. Premade or packaged taco seasonings. Relishes. Regular salad dressings. Where to find more information:  National Heart, Lung, and [place]: https://wilson-eaton.com/  American Heart Association: www.heart.org Summary  The DASH eating plan is a healthy eating plan that has been shown to reduce high blood pressure (hypertension). It may also reduce your risk for type 2 diabetes, heart disease, and stroke.  With the DASH eating plan, you should limit salt (sodium) intake to 2,300 mg a day. If you have hypertension, you may need to reduce your sodium intake to 1,500 mg a day.  When on the DASH eating plan, aim to eat  more fresh fruits and vegetables, whole grains, lean proteins, low-fat dairy, and heart-healthy fats.  Work with your health care provider or diet and nutrition specialist (dietitian) to adjust your eating plan to your individual calorie needs. This information is not intended to replace advice given to you by your health care provider. Make sure you discuss any questions you have with your health care provider. Document Revised: 01/23/2017 Document Reviewed:  02/04/2016 Elsevier Patient Education  2020 [employer].

## 2019-03-11 NOTE — Progress Notes (Signed)
Established Patient Office Visit     This visit occurred during the SARS-CoV-2 public health emergency.  Safety protocols were in place, including screening questions prior to the visit, additional usage of staff PPE, and extensive cleaning of exam room while observing appropriate contact time as indicated for disinfecting solutions.    CC/Reason for Visit: Blood pressure follow-up  HPI: Mike [name] is a 49 y.o. male who is coming in today for the above mentioned reasons.  He is here today to follow-up on his blood pressure.  Since I saw him 6 weeks ago he has lost 4 pounds and has been more consistent in taking his medications.  He has been doing well and has no complaints.   Past Medical/Surgical History: Past Medical History:  Diagnosis Date  . Hypertension   . Morbid obesity ([place])     No past surgical history on file.  Social History:  reports that he has never smoked. He has never used smokeless tobacco. He reports current alcohol use of about 21.0 standard drinks of alcohol per week. He reports that he does not use drugs.  Allergies: No Known Allergies  Family History:  Family History  Problem Relation Age of Onset  . Hypertension Mother   . Diabetes Mother   . Hypertension Father   . Diabetes Father   . Diabetes Brother      Current Outpatient Medications:  .  Bioflavonoid Products (BIOFLEX PO), Take 1 tablet by mouth daily., Disp: , Rfl:  .  doxycycline (VIBRA-TABS) 100 MG tablet, Take 1 tablet (100 mg total) by mouth 2 (two) times daily., Disp: 42 tablet, Rfl: 0 .  lisinopril-hydrochlorothiazide (ZESTORETIC) 10-12.5 MG tablet, Take 1 tablet by mouth daily., Disp: 90 tablet, Rfl: 1 .  Multiple Vitamin (MULTIVITAMIN PO), Take 1 tablet by mouth daily., Disp: , Rfl:  .  Omega-3 Fatty Acids (FISH OIL PO), Take 1 tablet by mouth daily., Disp: , Rfl:  .  TURMERIC PO, Take 1 tablet by mouth daily., Disp: , Rfl:   Review of Systems:  Constitutional: Denies  fever, chills, diaphoresis, appetite change and fatigue.  HEENT: Denies photophobia, eye pain, redness, hearing loss, ear pain, congestion, sore throat, rhinorrhea, sneezing, mouth sores, trouble swallowing, neck pain, neck stiffness and tinnitus.   Respiratory: Denies SOB, DOE, cough, chest tightness,  and wheezing.   Cardiovascular: Denies chest pain, palpitations and leg swelling.  Gastrointestinal: Denies nausea, vomiting, abdominal pain, diarrhea, constipation, blood in stool and abdominal distention.  Genitourinary: Denies dysuria, urgency, frequency, hematuria, flank pain and difficulty urinating.  Endocrine: Denies: hot or cold intolerance, sweats, changes in hair or nails, polyuria, polydipsia. Musculoskeletal: Denies myalgias, back pain, joint swelling, arthralgias and gait problem.  Skin: Denies pallor, rash and wound.  Neurological: Denies dizziness, seizures, syncope, weakness, light-headedness, numbness and headaches.  Hematological: Denies adenopathy. Easy bruising, personal or family bleeding history  Psychiatric/Behavioral: Denies suicidal ideation, mood changes, confusion, nervousness, sleep disturbance and agitation    Physical Exam: Vitals:   03/11/19 0903  BP: 120/90  Pulse: (!) 107  Temp: (!) 97.1 F (36.2 C)  TempSrc: Temporal  SpO2: 95%  Weight: (!) 349 lb 6.4 oz (158.5 kg)    Body mass index is 47.39 kg/m.   Constitutional: NAD, calm, comfortable Eyes: PERRL, lids and conjunctivae normal ENMT: Mucous membranes are moist.  Respiratory: clear to auscultation bilaterally, no wheezing, no crackles. Normal respiratory effort. No accessory muscle use.  Cardiovascular: Regular rate and rhythm, no murmurs / rubs /  gallops.  Trace bilateral lower extremity edema. Neurologic: Grossly intact and nonfocal Psychiatric: Normal judgment and insight. Alert and oriented x 3. Normal mood.    Impression and Plan:  Essential hypertension -While still not at goal,  significantly improved from last visit. -Continue weight loss and low-sodium dietary intake. -Follow-up in 3 months.  Morbid obesity ([place]) -Discussed healthy lifestyle, including increased physical activity and better food choices to promote weight loss. -He has lost 4 pounds in 6 weeks.    Patient Instructions  -Nice seeing you today!!  -Keep up the good work!  -Flu vaccine today.  -Continue to look at your daily salt intake (see below).  -Schedule a 3 month follow up.   DASH Eating Plan DASH stands for "Dietary Approaches to Stop Hypertension." The DASH eating plan is a healthy eating plan that has been shown to reduce high blood pressure (hypertension). It may also reduce your risk for type 2 diabetes, heart disease, and stroke. The DASH eating plan may also help with weight loss. What are tips for following this plan?  General guidelines  Avoid eating more than 2,300 mg (milligrams) of salt (sodium) a day. If you have hypertension, you may need to reduce your sodium intake to 1,500 mg a day.  Limit alcohol intake to no more than 1 drink a day for nonpregnant women and 2 drinks a day for men. One drink equals 12 oz of beer, 5 oz of wine, or 1 oz of hard liquor.  Work with your health care provider to maintain a healthy body weight or to lose weight. Ask what an ideal weight is for you.  Get at least 30 minutes of exercise that causes your heart to beat faster (aerobic exercise) most days of the week. Activities may include walking, swimming, or biking.  Work with your health care provider or diet and nutrition specialist (dietitian) to adjust your eating plan to your individual calorie needs. Reading food labels   Check food labels for the amount of sodium per serving. Choose foods with less than 5 percent of the Daily Value of sodium. Generally, foods with less than 300 mg of sodium per serving fit into this eating plan.  To find whole grains, look for the word "whole"  as the first word in the ingredient list. Shopping  Buy products labeled as "low-sodium" or "no salt added."  Buy fresh foods. Avoid canned foods and premade or frozen meals. Cooking  Avoid adding salt when cooking. Use salt-free seasonings or herbs instead of table salt or sea salt. Check with your health care provider or pharmacist before using salt substitutes.  Do not fry foods. Cook foods using healthy methods such as baking, boiling, grilling, and broiling instead.  Cook with heart-healthy oils, such as olive, canola, soybean, or sunflower oil. Meal planning  Eat a balanced diet that includes: ? 5 or more servings of fruits and vegetables each day. At each meal, try to fill half of your plate with fruits and vegetables. ? Up to 6-8 servings of whole grains each day. ? Less than 6 oz of lean meat, poultry, or fish each day. A 3-oz serving of meat is about the same size as a deck of cards. One egg equals 1 oz. ? 2 servings of low-fat dairy each day. ? A serving of nuts, seeds, or beans 5 times each week. ? Heart-healthy fats. Healthy fats called Omega-3 fatty acids are found in foods such as flaxseeds and coldwater fish, like sardines, salmon, and  mackerel.  Limit how much you eat of the following: ? Canned or prepackaged foods. ? Food that is high in trans fat, such as fried foods. ? Food that is high in saturated fat, such as fatty meat. ? Sweets, desserts, sugary drinks, and other foods with added sugar. ? Full-fat dairy products.  Do not salt foods before eating.  Try to eat at least 2 vegetarian meals each week.  Eat more home-cooked food and less restaurant, buffet, and fast food.  When eating at a restaurant, ask that your food be prepared with less salt or no salt, if possible. What foods are recommended? The items listed may not be a complete list. Talk with your dietitian about what dietary choices are best for you. Grains Whole-grain or whole-wheat bread.  Whole-grain or whole-wheat pasta. Brown rice. [place]. Bulgur. Whole-grain and low-sodium cereals. Pita bread. Low-fat, low-sodium crackers. Whole-wheat flour tortillas. Vegetables Fresh or frozen vegetables (raw, steamed, roasted, or grilled). Low-sodium or reduced-sodium tomato and vegetable juice. Low-sodium or reduced-sodium tomato sauce and tomato paste. Low-sodium or reduced-sodium canned vegetables. Fruits All fresh, dried, or frozen fruit. Canned fruit in natural juice (without added sugar). Meat and other protein foods Skinless chicken or [place]. Ground chicken or [place]. Pork with fat trimmed off. Fish and seafood. Egg whites. Dried beans, peas, or lentils. Unsalted nuts, nut butters, and seeds. Unsalted canned beans. Lean cuts of beef with fat trimmed off. Low-sodium, lean deli meat. Dairy Low-fat (1%) or fat-free (skim) milk. Fat-free, low-fat, or reduced-fat cheeses. Nonfat, low-sodium ricotta or cottage cheese. Low-fat or nonfat yogurt. Low-fat, low-sodium cheese. Fats and oils Soft margarine without trans fats. Vegetable oil. Low-fat, reduced-fat, or light mayonnaise and salad dressings (reduced-sodium). Canola, safflower, olive, soybean, and sunflower oils. Avocado. Seasoning and other foods Herbs. Spices. Seasoning mixes without salt. Unsalted popcorn and pretzels. Fat-free sweets. What foods are not recommended? The items listed may not be a complete list. Talk with your dietitian about what dietary choices are best for you. Grains Baked goods made with fat, such as croissants, muffins, or some breads. Dry pasta or rice meal packs. Vegetables Creamed or fried vegetables. Vegetables in a cheese sauce. Regular canned vegetables (not low-sodium or reduced-sodium). Regular canned tomato sauce and paste (not low-sodium or reduced-sodium). Regular tomato and vegetable juice (not low-sodium or reduced-sodium). Angie Fava. Olives. Fruits Canned fruit in a light or heavy syrup.  Fried fruit. Fruit in cream or butter sauce. Meat and other protein foods Fatty cuts of meat. Ribs. Fried meat. Berniece Salines. Sausage. Bologna and other processed lunch meats. Salami. Fatback. Hotdogs. Bratwurst. Salted nuts and seeds. Canned beans with added salt. Canned or smoked fish. Whole eggs or egg yolks. Chicken or [place] with skin. Dairy Whole or 2% milk, cream, and half-and-half. Whole or full-fat cream cheese. Whole-fat or sweetened yogurt. Full-fat cheese. Nondairy creamers. Whipped toppings. Processed cheese and cheese spreads. Fats and oils Butter. Stick margarine. Lard. Shortening. Ghee. Bacon fat. Tropical oils, such as coconut, palm kernel, or palm oil. Seasoning and other foods Salted popcorn and pretzels. Onion salt, garlic salt, seasoned salt, table salt, and sea salt. Worcestershire sauce. Tartar sauce. Barbecue sauce. Teriyaki sauce. Soy sauce, including reduced-sodium. Steak sauce. Canned and packaged gravies. Fish sauce. Oyster sauce. Cocktail sauce. Horseradish that you find on the shelf. Ketchup. Mustard. Meat flavorings and tenderizers. Bouillon cubes. Hot sauce and Tabasco sauce. Premade or packaged marinades. Premade or packaged taco seasonings. Relishes. Regular salad dressings. Where to find more information:  National Heart, Lung,  and Blood Institute: https://wilson-eaton.com/  American Heart Association: www.heart.org Summary  The DASH eating plan is a healthy eating plan that has been shown to reduce high blood pressure (hypertension). It may also reduce your risk for type 2 diabetes, heart disease, and stroke.  With the DASH eating plan, you should limit salt (sodium) intake to 2,300 mg a day. If you have hypertension, you may need to reduce your sodium intake to 1,500 mg a day.  When on the DASH eating plan, aim to eat more fresh fruits and vegetables, whole grains, lean proteins, low-fat dairy, and heart-healthy fats.  Work with your health care provider or diet and  nutrition specialist (dietitian) to adjust your eating plan to your individual calorie needs. This information is not intended to replace advice given to you by your health care provider. Make sure you discuss any questions you have with your health care provider. Document Revised: 01/23/2017 Document Reviewed: 02/04/2016 Elsevier Patient Education  2020 [place], [place] [facility]

## 2019-03-25 ENCOUNTER — Encounter: Payer: Self-pay | Admitting: Family Medicine

## 2019-03-25 ENCOUNTER — Ambulatory Visit (INDEPENDENT_AMBULATORY_CARE_PROVIDER_SITE_OTHER): Payer: 59 | Admitting: Family Medicine

## 2019-03-25 ENCOUNTER — Other Ambulatory Visit (INDEPENDENT_AMBULATORY_CARE_PROVIDER_SITE_OTHER): Payer: 59

## 2019-03-25 ENCOUNTER — Ambulatory Visit: Payer: Self-pay

## 2019-03-25 ENCOUNTER — Other Ambulatory Visit: Payer: Self-pay

## 2019-03-25 VITALS — BP 134/98 | HR 109 | Ht 72.0 in

## 2019-03-25 DIAGNOSIS — M79672 Pain in left foot: Secondary | ICD-10-CM | POA: Diagnosis not present

## 2019-03-25 DIAGNOSIS — M109 Gout, unspecified: Secondary | ICD-10-CM

## 2019-03-25 DIAGNOSIS — I10 Essential (primary) hypertension: Secondary | ICD-10-CM

## 2019-03-25 HISTORY — DX: Gout, unspecified: M10.9

## 2019-03-25 LAB — COMPREHENSIVE METABOLIC PANEL
ALT: 25 U/L (ref 0–53)
AST: 16 U/L (ref 0–37)
Albumin: 4.4 g/dL (ref 3.5–5.2)
Alkaline Phosphatase: 68 U/L (ref 39–117)
BUN: 14 mg/dL (ref 6–23)
CO2: 24 mEq/L (ref 19–32)
Calcium: 10 mg/dL (ref 8.4–10.5)
Chloride: 101 mEq/L (ref 96–112)
Creatinine, Ser: 0.88 mg/dL (ref 0.40–1.50)
GFR: 111.91 mL/min (ref >60.00)
Glucose, Bld: 108 mg/dL — ABNORMAL HIGH (ref 70–99)
Potassium: 3.8 mEq/L (ref 3.5–5.1)
Sodium: 135 mEq/L (ref 135–145)
Total Bilirubin: 0.8 mg/dL (ref 0.2–1.2)
Total Protein: 8 g/dL (ref 6.0–8.3)

## 2019-03-25 LAB — URIC ACID: Uric Acid, Serum: 9 mg/dL — ABNORMAL HIGH (ref 4.0–7.8)

## 2019-03-25 MED ORDER — TRAMADOL HCL 50 MG PO TABS: 50.0000 mg | ORAL_TABLET | Freq: Three times a day (TID) | ORAL | 0 refills | Status: DC | PRN

## 2019-03-25 MED ORDER — ALLOPURINOL 300 MG PO TABS: 300.0000 mg | ORAL_TABLET | Freq: Every day | ORAL | 3 refills | Status: DC

## 2019-03-25 MED ORDER — COLCHICINE 0.6 MG PO TABS: 0.6000 mg | ORAL_TABLET | Freq: Every day | ORAL | 2 refills | Status: DC

## 2019-03-25 NOTE — Progress Notes (Signed)
I, Mike [name], LAT, ATC, am serving as scribe for Dr. Lynne [name].  Mike [name] is a 49 y.o. male who presents to [place] at [facility] today for L foot pain along the top of his L foot x one week w/ no MOI.  Pt was last seen by Dr. Tamala Julian on 11/19/18 for his R foot and on 10/22/18 prior to that.  He has tried an oral antibiotic which seemed to significantly help his pain.  He was also prescribed vitamin D3 and either Spenco Total Support inserts or rocker bottom shoes were recommended.  Pt did not try the inserts or the shoes previously.  Since his last visit w/ Dr. Tamala Julian, pt reports that he started having pain on the top of his L foot x one week.  Pt rates his pain at a 9/10 at it's worst w/ weight bearing and he describes his pain as sharp and throbbing.  Pt denies any numbness or tingling into his L foot.  He states that his L foot is swollen, mainly on the top of his foot.  He started taking the antibiotics again that were originally prescribed for his R foot w/ no relief in his symptoms.  He con't to take supplements as prescribed by Dr. Tamala Julian to include vit D3, turmeric and tart cherry extract.  Pt ambulates w/ B axillary crutches, NWB on his L LE.  Diagnostic testing: L foot XR - 05/24/17; R foot XR - 09/03/18 and 03/29/17   Pertinent review of systems: no fever or chills  Relevant historical information: No history of gout, however never been tested. 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    Exam:  BP (!) 134/98 (BP Location: Left Arm, Patient Position: Sitting, Cuff Size: Large)   Pulse (!) 109   Ht 6' (1.829 m)   SpO2 98%   BMI 47.39 kg/m  General: Well Developed, well nourished, and in no acute distress.   MSK: Left foot: Mild swelling dorsal midfoot.  No erythema. Normal foot and ankle motion. Mildly tender palpation dorsal midfoot overlying medial side of dorsal midfoot. Skin is not hot to touch. Pulses cap refill and sensation are intact  distally. Normal foot and ankle motion. Some pain with resisted foot dorsiflexion.  No significant pain with great toe dorsiflexion. Stable ligamentous exam foot and ankle.     Lab and Radiology Results  Left dorsal foot Limited musculoskeletal ultrasound.  Mild subcutaneous soft tissue marbling overlying area of tenderness. Mild joint effusion arising from cuneiform navicular joint.  Area of tenderness. Normal bony structures otherwise.  Normal vascular structures. Extensor tendon is normal-appearing. Impression: Midfoot DJD with effusion.    EXAM: LEFT FOOT - COMPLETE 3+ VIEW 05/24/17  COMPARISON:  None.  FINDINGS: No acute fracture or dislocation. Small Achilles and calcaneal spurs. No significant soft tissue swelling.  IMPRESSION: No acute osseous abnormality.   Electronically Signed   By: Abigail Miyamoto M.D.   On: 05/24/2017 12:10 I, Mike [name], personally (independently) visualized and performed the interpretation of the images attached in this note.    Assessment and Plan: 49 y.o. male with left dorsal foot pain and swelling.  Pain is severe.  Pain occurred over about 1 week with no cause or good explanation.  [name] did try taking some leftover doxycycline which did not help making cellulitis much less likely.  Differential at this time includes gout is most likely explanation.  Plan to check uric acid and metabolic panel.  Will start treatment empirically with colchicine.  Limited tramadol for pain control.  Recommend existing cam walker boot and crutches.  If not improving patient return to clinic for steroid injection.  Work note provided   PDMP reviewed during this encounter. Orders Placed This Encounter  Procedures  . [place] LIMITED JOINT SPACE STRUCTURES LOW LEFT(NO LINKED CHARGES)    Order Specific Question:   Reason for Exam (SYMPTOM  OR DIAGNOSIS REQUIRED)    Answer:   L foot pain    Order Specific Question:   Preferred imaging location?     Answer:   [place]  . Uric acid    Standing Status:   Future    Number of Occurrences:   1    Standing Expiration Date:   03/24/2020  . Comp Met (CMET)    Standing Status:   Future    Number of Occurrences:   1    Standing Expiration Date:   03/24/2020   Meds ordered this encounter  Medications  . colchicine 0.6 MG tablet    Sig: Take 1 tablet (0.6 mg total) by mouth daily. For gout flair    Dispense:  30 tablet    Refill:  2  . traMADol (ULTRAM) 50 MG tablet    Sig: Take 1 tablet (50 mg total) by mouth every 8 (eight) hours as needed for severe pain.    Dispense:  10 tablet    Refill:  0     Discussed warning signs or symptoms. Please see discharge instructions. Patient expresses understanding.   The above documentation has been reviewed and is accurate and complete Mike [name]

## 2019-03-25 NOTE — Patient Instructions (Signed)
Thank you for coming in today. This pain is either due to GOUT or arthritis.  Use the cam walker boot as needed.  Take colchicine daily for gout flair for at least a few days.  Use tramadol for severe pain.  OK to also Aleve or ibuprofen for pain.  If not improving quickly next step is injection.  Get labs today. I am checking for gout.     Gout  Gout is painful swelling of your joints. Gout is a type of arthritis. It is caused by having too much uric acid in your body. Uric acid is a chemical that is made when your body breaks down substances called purines. If your body has too much uric acid, sharp crystals can form and build up in your joints. This causes pain and swelling. Gout attacks can happen quickly and be very painful (acute gout). Over time, the attacks can affect more joints and happen more often (chronic gout). What are the causes?  Too much uric acid in your blood. This can happen because: ? Your kidneys do not remove enough uric acid from your blood. ? Your body makes too much uric acid. ? You eat too many foods that are high in purines. These foods include organ meats, some seafood, and beer.  Trauma or stress. What increases the risk?  Having a family history of gout.  Being male and middle-aged.  Being male and having gone through menopause.  Being very overweight (obese).  Drinking alcohol, especially beer.  Not having enough water in the body (being dehydrated).  Losing weight too quickly.  Having an organ transplant.  Having lead poisoning.  Taking certain medicines.  Having kidney disease.  Having a skin condition called psoriasis. What are the signs or symptoms? An attack of acute gout usually happens in just one joint. The most common place is the big toe. Attacks often start at night. Other joints that may be affected include joints of the feet, ankle, knee, fingers, wrist, or elbow. Symptoms of an attack may include:  Very bad  pain.  Warmth.  Swelling.  Stiffness.  Shiny, red, or purple skin.  Tenderness. The affected joint may be very painful to touch.  Chills and fever. Chronic gout may cause symptoms more often. More joints may be involved. You may also have white or yellow lumps (tophi) on your hands or feet or in other areas near your joints. How is this treated?  Treatment for this condition has two phases: treating an acute attack and preventing future attacks.  Acute gout treatment may include: ? NSAIDs. ? Steroids. These are taken by mouth or injected into a joint. ? Colchicine. This medicine relieves pain and swelling. It can be given by mouth or through an IV tube.  Preventive treatment may include: ? Taking small doses of NSAIDs or colchicine daily. ? Using a medicine that reduces uric acid levels in your blood. ? Making changes to your diet. You may need to see a food expert (dietitian) about what to eat and drink to prevent gout. Follow these instructions at home: During a gout attack   If told, put ice on the painful area: ? Put ice in a plastic bag. ? Place a towel between your skin and the bag. ? Leave the ice on for 20 minutes, 2-3 times a day.  Raise (elevate) the painful joint above the level of your heart as often as you can.  Rest the joint as much as possible. If the joint  is in your leg, you may be given crutches.  Follow instructions from your doctor about what you cannot eat or drink. Avoiding future gout attacks  Eat a low-purine diet. Avoid foods and drinks such as: ? Liver. ? Kidney. ? Anchovies. ? Asparagus. ? Herring. ? Mushrooms. ? Mussels. ? Beer.  Stay at a healthy weight. If you want to lose weight, talk with your doctor. Do not lose weight too fast.  Start or continue an exercise plan as told by your doctor. Eating and drinking  Drink enough fluids to keep your pee (urine) pale yellow.  If you drink alcohol: ? Limit how much you use to:  0-1  drink a day for women.  0-2 drinks a day for men. ? Be aware of how much alcohol is in your drink. In the [place] one drink equals one 12 oz bottle of beer (355 mL), one 5 oz glass of wine (148 mL), or one 1 oz glass of hard liquor (44 mL). General instructions  Take over-the-counter and prescription medicines only as told by your doctor.  Do not drive or use heavy machinery while taking prescription pain medicine.  Return to your normal activities as told by your doctor. Ask your doctor what activities are safe for you.  Keep all follow-up visits as told by your doctor. This is important. Contact a doctor if:  You have another gout attack.  You still have symptoms of a gout attack after 10 days of treatment.  You have problems (side effects) because of your medicines.  You have chills or a fever.  You have burning pain when you pee (urinate).  You have pain in your lower back or belly. Get help right away if:  You have very bad pain.  Your pain cannot be controlled.  You cannot pee. Summary  Gout is painful swelling of the joints.  The most common site of pain is the big toe, but it can affect other joints.  Medicines and avoiding some foods can help to prevent and treat gout attacks. This information is not intended to replace advice given to you by your health care provider. Make sure you discuss any questions you have with your health care provider. Document Revised: 09/02/2017 Document Reviewed: 09/02/2017 Elsevier Patient Education  [place].

## 2019-03-25 NOTE — Progress Notes (Signed)
Uric acid is significantly elevated.  This means that your pain is almost certainly due to gout. As we discussed yesterday I will prescribe allopurinol.  Please start taking it daily.  Remember continue colchicine (the pills I prescribed yesterday) daily for at least 1-3 months to prevent gout flares while getting started on allopurinol.  Kidney function is normal.  This is great news.  Please return to clinic for recheck in 3 months.  We will recheck uric acid then and adjust allopurinol as needed.

## 2019-06-17 ENCOUNTER — Ambulatory Visit: Payer: 59 | Admitting: Family Medicine

## 2019-06-30 ENCOUNTER — Other Ambulatory Visit: Payer: Self-pay

## 2019-07-01 ENCOUNTER — Ambulatory Visit (INDEPENDENT_AMBULATORY_CARE_PROVIDER_SITE_OTHER): Payer: 59 | Admitting: Internal Medicine

## 2019-07-01 ENCOUNTER — Encounter: Payer: Self-pay | Admitting: Internal Medicine

## 2019-07-01 VITALS — BP 140/100 | HR 99 | Temp 99.0°F | Ht 72.0 in | Wt 342.6 lb

## 2019-07-01 DIAGNOSIS — I10 Essential (primary) hypertension: Secondary | ICD-10-CM | POA: Diagnosis not present

## 2019-07-01 MED ORDER — HYDROCHLOROTHIAZIDE 25 MG PO TABS: 25.0000 mg | ORAL_TABLET | Freq: Every day | ORAL | 1 refills | Status: DC

## 2019-07-01 MED ORDER — LISINOPRIL 20 MG PO TABS: 20.0000 mg | ORAL_TABLET | Freq: Every day | ORAL | 1 refills | Status: DC

## 2019-07-01 NOTE — Patient Instructions (Signed)
-  Nice seeing you today!!  -STOP taking your current blood pressure medication.  -Start HCTZ 25 mg daily and lisinopril 20 mg daily.  -Schedule follow up in 6 weeks.  -Continue low salt diet.  -Great job on the weight loss!

## 2019-07-01 NOTE — Progress Notes (Signed)
Established Patient Office Visit     This visit occurred during the SARS-CoV-2 public health emergency.  Safety protocols were in place, including screening questions prior to the visit, additional usage of staff PPE, and extensive cleaning of exam room while observing appropriate contact time as indicated for disinfecting solutions.    CC/Reason for Visit: Blood pressure follow-up  HPI: Mike [name] is a 49 y.o. male who is coming in today for the above mentioned reasons. Past Medical History is significant for: Hypertension and morbid obesity.  Since I last saw him he has received both of his Covid vaccinations.  He has been able to lose 7 pounds.  He has been fully compliant with his medications.  He states that when he checks his blood pressure at home it is always elevated in the 140s to [id number] with diastolics typically in the 90s.  He feels well and has no complaints.   Past Medical/Surgical History: Past Medical History:  Diagnosis Date  . Gout 03/25/2019   Uric acid 9.0 January 2021.  Starting allopurinol.  Prescribed colchicine for gout prophylaxis for the first 1-3 months.  . Hypertension   . Morbid obesity ([place])     No past surgical history on file.  Social History:  reports that he has never smoked. He has never used smokeless tobacco. He reports current alcohol use of about 21.0 standard drinks of alcohol per week. He reports that he does not use drugs.  Allergies: No Known Allergies  Family History:  Family History  Problem Relation Age of Onset  . Hypertension Mother   . Diabetes Mother   . Hypertension Father   . Diabetes Father   . Diabetes Brother      Current Outpatient Medications:  .  allopurinol (ZYLOPRIM) 300 MG tablet, Take 1 tablet (300 mg total) by mouth daily. To lower uric acid to prevent gout long-term., Disp: 90 tablet, Rfl: 3 .  Bioflavonoid Products (BIOFLEX PO), Take 1 tablet by mouth daily., Disp: , Rfl:  .  colchicine 0.6 MG  tablet, Take 1 tablet (0.6 mg total) by mouth daily. For gout flair, Disp: 30 tablet, Rfl: 2 .  doxycycline (VIBRA-TABS) 100 MG tablet, Take 1 tablet (100 mg total) by mouth 2 (two) times daily., Disp: 42 tablet, Rfl: 0 .  Multiple Vitamin (MULTIVITAMIN PO), Take 1 tablet by mouth daily., Disp: , Rfl:  .  Omega-3 Fatty Acids (FISH OIL PO), Take 1 tablet by mouth daily., Disp: , Rfl:  .  traMADol (ULTRAM) 50 MG tablet, Take 1 tablet (50 mg total) by mouth every 8 (eight) hours as needed for severe pain., Disp: 10 tablet, Rfl: 0 .  TURMERIC PO, Take 1 tablet by mouth daily., Disp: , Rfl:  .  hydrochlorothiazide (HYDRODIURIL) 25 MG tablet, Take 1 tablet (25 mg total) by mouth daily., Disp: 90 tablet, Rfl: 1 .  lisinopril (ZESTRIL) 20 MG tablet, Take 1 tablet (20 mg total) by mouth daily., Disp: 90 tablet, Rfl: 1  Review of Systems:  Constitutional: Denies fever, chills, diaphoresis, appetite change and fatigue.  HEENT: Denies photophobia, eye pain, redness, hearing loss, ear pain, congestion, sore throat, rhinorrhea, sneezing, mouth sores, trouble swallowing, neck pain, neck stiffness and tinnitus.   Respiratory: Denies SOB, DOE, cough, chest tightness,  and wheezing.   Cardiovascular: Denies chest pain, palpitations and leg swelling.  Gastrointestinal: Denies nausea, vomiting, abdominal pain, diarrhea, constipation, blood in stool and abdominal distention.  Genitourinary: Denies dysuria, urgency, frequency, hematuria, flank  pain and difficulty urinating.  Endocrine: Denies: hot or cold intolerance, sweats, changes in hair or nails, polyuria, polydipsia. Musculoskeletal: Denies myalgias, back pain, joint swelling, arthralgias and gait problem.  Skin: Denies pallor, rash and wound.  Neurological: Denies dizziness, seizures, syncope, weakness, light-headedness, numbness and headaches.  Hematological: Denies adenopathy. Easy bruising, personal or family bleeding history  Psychiatric/Behavioral: Denies  suicidal ideation, mood changes, confusion, nervousness, sleep disturbance and agitation    Physical Exam: Vitals:   07/01/19 0907  BP: (!) 140/100  Pulse: 99  Temp: 99 F (37.2 C)  TempSrc: Temporal  SpO2: 98%  Weight: (!) 342 lb 9.6 oz (155.4 kg)  Height: 6' (1.829 m)    Body mass index is 46.46 kg/m.   Constitutional: NAD, calm, comfortable, obese Eyes: PERRL, lids and conjunctivae normal ENMT: Mucous membranes are moist.  Respiratory: clear to auscultation bilaterally, no wheezing, no crackles. Normal respiratory effort. No accessory muscle use.  Cardiovascular: Regular rate and rhythm, no murmurs / rubs / gallops. No extremity edema.  Neurologic: Grossly intact and nonfocal psychiatric: Normal judgment and insight. Alert and oriented x 3. Normal mood.    Impression and Plan:  Essential hypertension  -Blood pressure remains elevated. -Increase lisinopril to 20 mg and hydrochlorothiazide to 25 mg, continue low-sodium diet. -Return in 6 weeks for follow-up.  Morbid obesity ([place]) -Discussed healthy lifestyle, including increased physical activity and better food choices to promote weight loss. -He has been congratulated on his current weight loss.    Patient Instructions  -Nice seeing you today!!  -STOP taking your current blood pressure medication.  -Start HCTZ 25 mg daily and lisinopril 20 mg daily.  -Schedule follow up in 6 weeks.  -Continue low salt diet.  -Great job on the weight loss!     Lelon Frohlich, MD [facility] at [facility]

## 2019-08-11 ENCOUNTER — Other Ambulatory Visit: Payer: Self-pay

## 2019-08-12 ENCOUNTER — Encounter: Payer: Self-pay | Admitting: Family Medicine

## 2019-08-12 ENCOUNTER — Ambulatory Visit (INDEPENDENT_AMBULATORY_CARE_PROVIDER_SITE_OTHER): Payer: 59 | Admitting: Family Medicine

## 2019-08-12 VITALS — BP 126/82 | HR 107 | Temp 97.6°F | Resp 12 | Ht 72.0 in | Wt 346.4 lb

## 2019-08-12 DIAGNOSIS — I10 Essential (primary) hypertension: Secondary | ICD-10-CM | POA: Diagnosis not present

## 2019-08-12 DIAGNOSIS — H02849 Edema of unspecified eye, unspecified eyelid: Secondary | ICD-10-CM | POA: Diagnosis not present

## 2019-08-12 DIAGNOSIS — R000 Tachycardia, unspecified: Secondary | ICD-10-CM

## 2019-08-12 MED ORDER — LOSARTAN POTASSIUM 50 MG PO TABS: 50.0000 mg | ORAL_TABLET | Freq: Every day | ORAL | 1 refills | Status: DC

## 2019-08-12 NOTE — Patient Instructions (Addendum)
A few things to remember from today's visit:   Edema eyelid, unspecified laterality  Regular sinus tachycardia  Essential hypertension  ? Angioedema. Stop Lisinopril. Losartan started today,take it at bedtime. No changes in hydrochlorothiazide. Monitor blood pressure closely. Do not take Benadryl.  Please be sure medication list is accurate. If a new problem present, please set up appointment sooner than planned today.

## 2019-08-12 NOTE — Progress Notes (Signed)
ACUTE VISIT Chief Complaint  Patient presents with  . Swelling of eyelids    started last Monday night, gets worse at night   HPI: Mr.Landon R Gros is a 49 y.o. male with hx of OA,gout,and HTN today with above complaint. 5 days of upper eye lids edema, heavy feeling. Problem is constant.  Problem is worse at night and first thing in the morning. During the day it gets better. Negative for associated skin erythema,pruritus, or pain. Today problem seems better.  No associated headache,visual changes,epiphora,conjuctival erythema or discharge.  Negative for dysphagia,odynophagia,stridor,cough,wheezing,or dyspnea.  No known outdoors exposure or insect bite.  No prior Hx. He has taken Benadryl.  No new medications. He is on Lisinopril 20 mg, recently increased from 5 mg (per pt report). Also on HCTZ 25 mg daily.  Negative for CP,palpitations, PND,orthopnea,gross hematuria,decreased urine output,foam in urine, or edema.  Review of Systems  Constitutional: Negative for activity change, appetite change and fatigue.  HENT: Negative for congestion, mouth sores, nosebleeds, postnasal drip and rhinorrhea.   Eyes: Negative for photophobia and visual disturbance.  Gastrointestinal: Negative for abdominal pain, nausea and vomiting.  Musculoskeletal: Negative for gait problem and myalgias.  Allergic/Immunologic: Negative for environmental allergies.  Neurological: Negative for syncope, facial asymmetry and weakness.  Rest see pertinent positives and negatives per HPI.  Current Outpatient Medications on File Prior to Visit  Medication Sig Dispense Refill  . allopurinol (ZYLOPRIM) 300 MG tablet Take 1 tablet (300 mg total) by mouth daily. To lower uric acid to prevent gout long-term. 90 tablet 3  . Bioflavonoid Products (BIOFLEX PO) Take 1 tablet by mouth daily.    . colchicine 0.6 MG tablet Take 1 tablet (0.6 mg total) by mouth daily. For gout flair 30 tablet 2  .  hydrochlorothiazide (HYDRODIURIL) 25 MG tablet Take 1 tablet (25 mg total) by mouth daily. 90 tablet 1  . Multiple Vitamin (MULTIVITAMIN PO) Take 1 tablet by mouth daily.    . Omega-3 Fatty Acids (FISH OIL PO) Take 1 tablet by mouth daily.    . traMADol (ULTRAM) 50 MG tablet Take 1 tablet (50 mg total) by mouth every 8 (eight) hours as needed for severe pain. 10 tablet 0  . TURMERIC PO Take 1 tablet by mouth daily.     No current facility-administered medications on file prior to visit.    Past Medical History:  Diagnosis Date  . Gout 03/25/2019   Uric acid 9.0 January 2021.  Starting allopurinol.  Prescribed colchicine for gout prophylaxis for the first 1-3 months.  . Hypertension   . Morbid obesity ([place])    No Known Allergies  Social History   Socioeconomic History  . Marital status: Married    Spouse name: Not on file  . Number of children: Not on file  . Years of education: Not on file  . Highest education level: Not on file  Occupational History  . Not on file  Tobacco Use  . Smoking status: Never Smoker  . Smokeless tobacco: Never Used  Substance and Sexual Activity  . Alcohol use: Yes    Alcohol/week: 21.0 standard drinks    Types: 21 Cans of beer per week  . Drug use: No  . Sexual activity: Yes  Other Topics Concern  . Not on file  Social History Narrative  . Not on file   Social Determinants of Health   Financial Resource Strain:   . Difficulty of Paying Living Expenses:   Food  Insecurity:   . Worried About Charity fundraiser in the Last Year:   . Arboriculturist in the Last Year:   Transportation Needs:   . Film/video editor (Medical):   Marland Kitchen Lack of Transportation (Non-Medical):   Physical Activity:   . Days of Exercise per Week:   . Minutes of Exercise per Session:   Stress:   . Feeling of Stress :   Social Connections:   . Frequency of Communication with Friends and Family:   . Frequency of Social Gatherings with Friends and Family:   .  Attends Religious Services:   . Active Member of Clubs or Organizations:   . Attends Archivist Meetings:   Marland Kitchen Marital Status:     Vitals:   08/12/19 0734  BP: 126/82  Pulse: (!) 107  Resp: 12  Temp: 97.6 F (36.4 C)  SpO2: 97%   Body mass index is 46.98 kg/m.  Physical Exam  Constitutional: He appears well-developed. No distress.  HENT:  Head: Normocephalic and atraumatic.  Mouth/Throat: Uvula is midline. Mucous membranes are moist. No uvula swelling. Oropharynx is clear.  Eyes: Conjunctivae are normal. Right eye exhibits no discharge and no hordeolum. No foreign body present in the right eye. Left eye exhibits no discharge and no hordeolum. No foreign body present in the left eye.  Bilateral upper eyelid edema.  Neck: No tracheal deviation present.  Cardiovascular: Regular rhythm. Tachycardia present.  No murmur heard. Respiratory: Effort normal and breath sounds normal. No respiratory distress.  GI: Normal appearance.  Lymphadenopathy:    He has no cervical adenopathy.  Neurological: He is alert. He has intact cranial nerves. Gait normal.  Skin: Skin is warm. No lesion and no rash noted. No erythema.  Psychiatric:  Well groomed, good eye contact.   ASSESSMENT AND PLAN:  Mr.Cora was seen today for swelling of eyelids.  Diagnoses and all orders for this visit:  Edema eyelid, unspecified laterality Possible etiologies discussed. ? Angioedema. Lisinopril discontinued.  Continue monitoring for new symptoms. Clearly instructed about warning signs.  Regular sinus tachycardia HR 108/min. Asymptomatic. Avoid Benadryl.  I did teach him how to monitor HR at home. Adequate hydration. Instructed about warning signs.  Essential hypertension Well controlled. He agrees with changing Lisinopril to Losartan 50 mg daily. No changes in HCTZ. Low salt diet. Monitor BP regularly. Keep next appt with PCP.  -     losartan (COZAAR) 50 MG tablet; Take 1  tablet (50 mg total) by mouth daily.  Return for Keep next appt with pcp. 09/09/19.   Burris Matherne G. [place], MD  [facility]. [place] office.  Discharge Instructions   None

## 2019-08-19 ENCOUNTER — Ambulatory Visit: Payer: 59 | Admitting: Internal Medicine

## 2019-09-04 ENCOUNTER — Other Ambulatory Visit: Payer: Self-pay | Admitting: Family Medicine

## 2019-09-04 DIAGNOSIS — I10 Essential (primary) hypertension: Secondary | ICD-10-CM

## 2019-09-09 ENCOUNTER — Ambulatory Visit (INDEPENDENT_AMBULATORY_CARE_PROVIDER_SITE_OTHER): Payer: 59 | Admitting: Internal Medicine

## 2019-09-09 ENCOUNTER — Other Ambulatory Visit: Payer: Self-pay

## 2019-09-09 ENCOUNTER — Encounter: Payer: Self-pay | Admitting: Internal Medicine

## 2019-09-09 VITALS — BP 170/120 | HR 106 | Temp 98.0°F | Wt 361.0 lb

## 2019-09-09 DIAGNOSIS — I10 Essential (primary) hypertension: Secondary | ICD-10-CM | POA: Diagnosis not present

## 2019-09-09 MED ORDER — METOPROLOL TARTRATE 25 MG PO TABS: 25.0000 mg | ORAL_TABLET | Freq: Two times a day (BID) | ORAL | 1 refills | Status: DC

## 2019-09-09 NOTE — Patient Instructions (Signed)
-  Nice seeing you today!!  -Stop losartan.  -Resume HCTZ 25 mg once daily.  -Start metoprolol 25 mg twice daily.  -Schedule 6 week follow up for your blood pressure.

## 2019-09-09 NOTE — Progress Notes (Signed)
Established Patient Office Visit     This visit occurred during the SARS-CoV-2 public health emergency.  Safety protocols were in place, including screening questions prior to the visit, additional usage of staff PPE, and extensive cleaning of exam room while observing appropriate contact time as indicated for disinfecting solutions.    CC/Reason for Visit: Follow-up blood pressure  HPI: Mike [name] is a 49 y.o. male who is coming in today for the above mentioned reasons.  He was seen in clinic recently by another provider due to swelling of his eyelids.  It was thought he might have angioedema due to lisinopril so this medication was discontinued and he was put on losartan.  His blood pressure remains quite uncontrolled and is 170/120 today.  He feels the eye edema is better but still there.  He has not had any tongue swelling or lip swelling, difficulty breathing.   Past Medical/Surgical History: Past Medical History:  Diagnosis Date  . Gout 03/25/2019   Uric acid 9.0 January 2021.  Starting allopurinol.  Prescribed colchicine for gout prophylaxis for the first 1-3 months.  . Hypertension   . Morbid obesity ([place])     No past surgical history on file.  Social History:  reports that he has never smoked. He has never used smokeless tobacco. He reports current alcohol use of about 21.0 standard drinks of alcohol per week. He reports that he does not use drugs.  Allergies: Allergies  Allergen Reactions  . Lisinopril     Family History:  Family History  Problem Relation Age of Onset  . Hypertension Mother   . Diabetes Mother   . Hypertension Father   . Diabetes Father   . Diabetes Brother      Current Outpatient Medications:  .  allopurinol (ZYLOPRIM) 300 MG tablet, Take 1 tablet (300 mg total) by mouth daily. To lower uric acid to prevent gout long-term., Disp: 90 tablet, Rfl: 3 .  Bioflavonoid Products (BIOFLEX PO), Take 1 tablet by mouth daily., Disp: , Rfl:    .  colchicine 0.6 MG tablet, Take 1 tablet (0.6 mg total) by mouth daily. For gout flair, Disp: 30 tablet, Rfl: 2 .  hydrochlorothiazide (HYDRODIURIL) 25 MG tablet, Take 1 tablet (25 mg total) by mouth daily., Disp: 90 tablet, Rfl: 1 .  Multiple Vitamin (MULTIVITAMIN PO), Take 1 tablet by mouth daily., Disp: , Rfl:  .  Omega-3 Fatty Acids (FISH OIL PO), Take 1 tablet by mouth daily., Disp: , Rfl:  .  traMADol (ULTRAM) 50 MG tablet, Take 1 tablet (50 mg total) by mouth every 8 (eight) hours as needed for severe pain., Disp: 10 tablet, Rfl: 0 .  TURMERIC PO, Take 1 tablet by mouth daily., Disp: , Rfl:  .  metoprolol tartrate (LOPRESSOR) 25 MG tablet, Take 1 tablet (25 mg total) by mouth 2 (two) times daily., Disp: 180 tablet, Rfl: 1  Review of Systems:  Constitutional: Denies fever, chills, diaphoresis, appetite change and fatigue.  HEENT: Denies photophobia, eye pain, redness, hearing loss, ear pain, congestion, sore throat, rhinorrhea, sneezing, mouth sores, trouble swallowing, neck pain, neck stiffness and tinnitus.   Respiratory: Denies SOB, DOE, cough, chest tightness,  and wheezing.   Cardiovascular: Denies chest pain, palpitations and leg swelling.  Gastrointestinal: Denies nausea, vomiting, abdominal pain, diarrhea, constipation, blood in stool and abdominal distention.  Genitourinary: Denies dysuria, urgency, frequency, hematuria, flank pain and difficulty urinating.  Endocrine: Denies: hot or cold intolerance, sweats, changes in hair or  nails, polyuria, polydipsia. Musculoskeletal: Denies myalgias, back pain, joint swelling, arthralgias and gait problem.  Skin: Denies pallor, rash and wound.  Neurological: Denies dizziness, seizures, syncope, weakness, light-headedness, numbness and headaches.  Hematological: Denies adenopathy. Easy bruising, personal or family bleeding history  Psychiatric/Behavioral: Denies suicidal ideation, mood changes, confusion, nervousness, sleep disturbance and  agitation    Physical Exam: Vitals:   09/09/19 0906  BP: (!) 170/120  Pulse: (!) 106  Temp: 98 F (36.7 C)  TempSrc: Temporal  SpO2: 96%  Weight: (!) 361 lb (163.7 kg)    Body mass index is 48.96 kg/m.   Constitutional: NAD, calm, comfortable, morbidly obese Eyes: PERRL, still some swelling of the upper and lower eyelids remains. ENMT: Mucous membranes are moist.  Respiratory: clear to auscultation bilaterally, no wheezing, no crackles. Normal respiratory effort. No accessory muscle use.  Cardiovascular: Regular rate and rhythm, no murmurs / rubs / gallops. No extremity edema.   Neurologic: Grossly intact and nonfocal Psychiatric: Normal judgment and insight. Alert and oriented x 3. Normal mood.    Impression and Plan:  Essential hypertension -Blood pressure remains quite uncontrolled at 170/120 today. -With edema being the lisinopril allergy and continued edema of his eyelids, stop losartan. -He has not been taking his hydrochlorothiazide, I have asked him to resume. -I will also add metoprolol 25 mg twice daily. -He will continue to do ambulatory blood pressure measurements and return in 6 weeks for follow-up.  Morbid obesity ([place]) -Discussed healthy lifestyle, including increased physical activity and better food choices to promote weight loss. -He plans on resuming going to the gym.    Patient Instructions  -Nice seeing you today!!  -Stop losartan.  -Resume HCTZ 25 mg once daily.  -Start metoprolol 25 mg twice daily.  -Schedule 6 week follow up for your blood pressure.     Lelon Frohlich, MD [facility] at [facility]

## 2019-10-28 ENCOUNTER — Encounter: Payer: Self-pay | Admitting: Internal Medicine

## 2019-10-28 ENCOUNTER — Ambulatory Visit (INDEPENDENT_AMBULATORY_CARE_PROVIDER_SITE_OTHER): Payer: 59 | Admitting: Internal Medicine

## 2019-10-28 ENCOUNTER — Other Ambulatory Visit: Payer: Self-pay

## 2019-10-28 DIAGNOSIS — Z23 Encounter for immunization: Secondary | ICD-10-CM

## 2019-10-28 DIAGNOSIS — I10 Essential (primary) hypertension: Secondary | ICD-10-CM

## 2019-10-28 MED ORDER — METOPROLOL TARTRATE 50 MG PO TABS: 25.0000 mg | ORAL_TABLET | Freq: Two times a day (BID) | ORAL | Status: DC

## 2019-10-28 NOTE — Addendum Note (Signed)
Addended by: Westley Hummer B on: 10/28/2019 01:19 PM   Modules accepted: Orders

## 2019-10-28 NOTE — Progress Notes (Signed)
Established Patient Office Visit     This visit occurred during the SARS-CoV-2 public health emergency.  Safety protocols were in place, including screening questions prior to the visit, additional usage of staff PPE, and extensive cleaning of exam room while observing appropriate contact time as indicated for disinfecting solutions.    CC/Reason for Visit: BP follow up  HPI: Mike [name] is a 49 y.o. male who is coming in today for the above mentioned reasons. Past Medical History is significant for: HTN and gout. We have been having difficulty controlling his BP ever since his ARB was discontinued due to angioedema. Last visit he was started on metoprolol 25 mg BID in addition to HCTZ 25 mg. BP in office today is 160/100. He has been feeling well and has no acute complaints. He is requesting his flu vaccine today.,   Past Medical/Surgical History: Past Medical History:  Diagnosis Date  . Gout 03/25/2019   Uric acid 9.0 January 2021.  Starting allopurinol.  Prescribed colchicine for gout prophylaxis for the first 1-3 months.  . Hypertension   . Morbid obesity ([place])     No past surgical history on file.  Social History:  reports that he has never smoked. He has never used smokeless tobacco. He reports current alcohol use of about 21.0 standard drinks of alcohol per week. He reports that he does not use drugs.  Allergies: Allergies  Allergen Reactions  . Lisinopril     Family History:  Family History  Problem Relation Age of Onset  . Hypertension Mother   . Diabetes Mother   . Hypertension Father   . Diabetes Father   . Diabetes Brother      Current Outpatient Medications:  .  allopurinol (ZYLOPRIM) 300 MG tablet, Take 1 tablet (300 mg total) by mouth daily. To lower uric acid to prevent gout long-term., Disp: 90 tablet, Rfl: 3 .  Bioflavonoid Products (BIOFLEX PO), Take 1 tablet by mouth daily., Disp: , Rfl:  .  colchicine 0.6 MG tablet, Take 1 tablet (0.6 mg  total) by mouth daily. For gout flair, Disp: 30 tablet, Rfl: 2 .  hydrochlorothiazide (HYDRODIURIL) 25 MG tablet, Take 1 tablet (25 mg total) by mouth daily., Disp: 90 tablet, Rfl: 1 .  metoprolol tartrate (LOPRESSOR) 50 MG tablet, Take 0.5 tablets (25 mg total) by mouth 2 (two) times daily., Disp: , Rfl:  .  Multiple Vitamin (MULTIVITAMIN PO), Take 1 tablet by mouth daily., Disp: , Rfl:  .  Omega-3 Fatty Acids (FISH OIL PO), Take 1 tablet by mouth daily., Disp: , Rfl:  .  traMADol (ULTRAM) 50 MG tablet, Take 1 tablet (50 mg total) by mouth every 8 (eight) hours as needed for severe pain., Disp: 10 tablet, Rfl: 0 .  TURMERIC PO, Take 1 tablet by mouth daily., Disp: , Rfl:   Review of Systems:  Constitutional: Denies fever, chills, diaphoresis, appetite change and fatigue.  HEENT: Denies photophobia, eye pain, redness, hearing loss, ear pain, congestion, sore throat, rhinorrhea, sneezing, mouth sores, trouble swallowing, neck pain, neck stiffness and tinnitus.   Respiratory: Denies SOB, DOE, cough, chest tightness,  and wheezing.   Cardiovascular: Denies chest pain, palpitations and leg swelling.  Gastrointestinal: Denies nausea, vomiting, abdominal pain, diarrhea, constipation, blood in stool and abdominal distention.  Genitourinary: Denies dysuria, urgency, frequency, hematuria, flank pain and difficulty urinating.  Endocrine: Denies: hot or cold intolerance, sweats, changes in hair or nails, polyuria, polydipsia. Musculoskeletal: Denies myalgias, back pain, joint swelling,  arthralgias and gait problem.  Skin: Denies pallor, rash and wound.  Neurological: Denies dizziness, seizures, syncope, weakness, light-headedness, numbness and headaches.  Hematological: Denies adenopathy. Easy bruising, personal or family bleeding history  Psychiatric/Behavioral: Denies suicidal ideation, mood changes, confusion, nervousness, sleep disturbance and agitation    Physical Exam: Vitals:   10/28/19 0837    BP: (!) 160/100  Pulse: 84  Temp: 98 F (36.7 C)  TempSrc: Oral  SpO2: 97%  Weight: (!) 359 lb 9.6 oz (163.1 kg)    Body mass index is 48.77 kg/m.   Constitutional: NAD, calm, comfortable, obese Eyes: PERRL, lids and conjunctivae normal ENMT: Mucous membranes are moist.  Respiratory: clear to auscultation bilaterally, no wheezing, no crackles. Normal respiratory effort. No accessory muscle use.  Cardiovascular: Regular rate and rhythm, no murmurs / rubs / gallops. No extremity edema.  Neurologic: grossly intact and non-focal Psychiatric: Normal judgment and insight. Alert and oriented x 3. Normal mood.    Impression and Plan:  Morbid obesity ([place]) -Discussed healthy lifestyle, including increased physical activity and better food choices to promote weight loss.  Essential hypertension -Not controlled. -Increase metoprolol to 50 mg BID and return in 8 weeks for follow up.    Patient Instructions  -Nice seeing you today!!  -Increase metoprolol to 50 mg twice daily (2 tablets twice a day).  -Schedule follow up in 8 weeks.       Lelon Frohlich, MD [facility] at [facility]

## 2019-10-28 NOTE — Patient Instructions (Signed)
-  Nice seeing you today!!  -Increase metoprolol to 50 mg twice daily (2 tablets twice a day).  -Schedule follow up in 8 weeks.

## 2019-12-16 ENCOUNTER — Encounter: Payer: Self-pay | Admitting: Internal Medicine

## 2019-12-16 ENCOUNTER — Ambulatory Visit (INDEPENDENT_AMBULATORY_CARE_PROVIDER_SITE_OTHER): Payer: 59 | Admitting: Internal Medicine

## 2019-12-16 ENCOUNTER — Other Ambulatory Visit: Payer: Self-pay

## 2019-12-16 VITALS — BP 152/86 | HR 93 | Temp 98.0°F | Ht 73.0 in | Wt 351.2 lb

## 2019-12-16 DIAGNOSIS — I10 Essential (primary) hypertension: Secondary | ICD-10-CM

## 2019-12-16 MED ORDER — HYDROCHLOROTHIAZIDE 25 MG PO TABS: 25.0000 mg | ORAL_TABLET | Freq: Every day | ORAL | 1 refills | Status: DC

## 2019-12-16 MED ORDER — METOPROLOL TARTRATE 50 MG PO TABS: 50.0000 mg | ORAL_TABLET | Freq: Two times a day (BID) | ORAL | 1 refills | Status: DC

## 2019-12-16 NOTE — Progress Notes (Signed)
Established Patient Office Visit     This visit occurred during the SARS-CoV-2 public health emergency.  Safety protocols were in place, including screening questions prior to the visit, additional usage of staff PPE, and extensive cleaning of exam room while observing appropriate contact time as indicated for disinfecting solutions.    CC/Reason for Visit: Blood pressure follow-up  HPI: Mike [name] is a 49 y.o. male who is coming in today for the above mentioned reasons.  He is here today for blood pressure follow-up.  Despite instructions at last visit he is not sure what medications he is supposed to be taking.  The pharmacy also called him with the losartan refill which we had discontinued due to his angioedema to lisinopril.  He has been feeling well and has no acute complaints.  He is excited to be going on a cruise at the end of the month.   Past Medical/Surgical History: Past Medical History:  Diagnosis Date  . Gout 03/25/2019   Uric acid 9.0 January 2021.  Starting allopurinol.  Prescribed colchicine for gout prophylaxis for the first 1-3 months.  . Hypertension   . Morbid obesity ([place])     No past surgical history on file.  Social History:  reports that he has never smoked. He has never used smokeless tobacco. He reports current alcohol use of about 21.0 standard drinks of alcohol per week. He reports that he does not use drugs.  Allergies: Allergies  Allergen Reactions  . Lisinopril     Family History:  Family History  Problem Relation Age of Onset  . Hypertension Mother   . Diabetes Mother   . Hypertension Father   . Diabetes Father   . Diabetes Brother      Current Outpatient Medications:  .  allopurinol (ZYLOPRIM) 300 MG tablet, Take 1 tablet (300 mg total) by mouth daily. To lower uric acid to prevent gout long-term., Disp: 90 tablet, Rfl: 3 .  hydrochlorothiazide (HYDRODIURIL) 25 MG tablet, Take 1 tablet (25 mg total) by mouth daily., Disp: 90  tablet, Rfl: 1 .  metoprolol tartrate (LOPRESSOR) 50 MG tablet, Take 1 tablet (50 mg total) by mouth 2 (two) times daily., Disp: 180 tablet, Rfl: 1 .  TURMERIC PO, Take 1 tablet by mouth daily., Disp: , Rfl:   Review of Systems:  Constitutional: Denies fever, chills, diaphoresis, appetite change and fatigue.  HEENT: Denies photophobia, eye pain, redness, hearing loss, ear pain, congestion, sore throat, rhinorrhea, sneezing, mouth sores, trouble swallowing, neck pain, neck stiffness and tinnitus.   Respiratory: Denies SOB, DOE, cough, chest tightness,  and wheezing.   Cardiovascular: Denies chest pain, palpitations and leg swelling.  Gastrointestinal: Denies nausea, vomiting, abdominal pain, diarrhea, constipation, blood in stool and abdominal distention.  Genitourinary: Denies dysuria, urgency, frequency, hematuria, flank pain and difficulty urinating.  Endocrine: Denies: hot or cold intolerance, sweats, changes in hair or nails, polyuria, polydipsia. Musculoskeletal: Denies myalgias, back pain, joint swelling, arthralgias and gait problem.  Skin: Denies pallor, rash and wound.  Neurological: Denies dizziness, seizures, syncope, weakness, light-headedness, numbness and headaches.  Hematological: Denies adenopathy. Easy bruising, personal or family bleeding history  Psychiatric/Behavioral: Denies suicidal ideation, mood changes, confusion, nervousness, sleep disturbance and agitation    Physical Exam: Vitals:   12/16/19 0931  BP: (!) 152/86  Pulse: 93  Temp: 98 F (36.7 C)  TempSrc: Oral  SpO2: 97%  Weight: (!) 351 lb 3.2 oz (159.3 kg)  Height: 6\' 1"  (1.854 m)  Body mass index is 46.34 kg/m.   Constitutional: NAD, calm, comfortable Eyes: PERRL, lids and conjunctivae normal ENMT: Mucous membranes are moist.  Respiratory: clear to auscultation bilaterally, no wheezing, no crackles. Normal respiratory effort. No accessory muscle use.  Cardiovascular: Regular rate and rhythm, no  murmurs / rubs / gallops. No extremity edema.  Neurologic: Grossly intact and nonfocal Psychiatric: Normal judgment and insight. Alert and oriented x 3. Normal mood.    Impression and Plan:  Primary hypertension -This medication should be hydrochlorothiazide 25 mg daily and metoprolol 50 mg twice daily. -Instructions and detailed medication list have been printed out for him today. -Low-sodium diet has again been discussed. -He will follow-up in 6 weeks.    Patient Instructions  -Nice seeing you today!!  -For Blood pressure:   HCTZ 25 mg daily Metoprolol 50 mg twice daily  Schedule follow up in 6 weeks.     Lelon Frohlich, MD [facility] at [facility]

## 2019-12-16 NOTE — Patient Instructions (Signed)
-  Nice seeing you today!!  -For Blood pressure:   HCTZ 25 mg daily Metoprolol 50 mg twice daily  Schedule follow up in 6 weeks.

## 2019-12-23 ENCOUNTER — Ambulatory Visit: Payer: 59 | Admitting: Internal Medicine

## 2019-12-24 ENCOUNTER — Other Ambulatory Visit: Payer: Self-pay | Admitting: Internal Medicine

## 2019-12-24 DIAGNOSIS — I10 Essential (primary) hypertension: Secondary | ICD-10-CM

## 2020-02-10 ENCOUNTER — Encounter: Payer: Self-pay | Admitting: Internal Medicine

## 2020-02-10 ENCOUNTER — Ambulatory Visit (INDEPENDENT_AMBULATORY_CARE_PROVIDER_SITE_OTHER): Payer: 59 | Admitting: Internal Medicine

## 2020-02-10 ENCOUNTER — Other Ambulatory Visit: Payer: Self-pay

## 2020-02-10 VITALS — BP 130/90 | HR 97 | Temp 97.7°F | Wt 349.1 lb

## 2020-02-10 DIAGNOSIS — M109 Gout, unspecified: Secondary | ICD-10-CM | POA: Diagnosis not present

## 2020-02-10 DIAGNOSIS — I10 Essential (primary) hypertension: Secondary | ICD-10-CM | POA: Diagnosis not present

## 2020-02-10 MED ORDER — AMLODIPINE BESYLATE 5 MG PO TABS: 5.0000 mg | ORAL_TABLET | Freq: Every day | ORAL | 1 refills | Status: DC

## 2020-02-10 NOTE — Progress Notes (Signed)
Established Patient Office Visit     This visit occurred during the SARS-CoV-2 public health emergency.  Safety protocols were in place, including screening questions prior to the visit, additional usage of staff PPE, and extensive cleaning of exam room while observing appropriate contact time as indicated for disinfecting solutions.    CC/Reason for Visit: Follow-up chronic conditions  HPI: Mike [name] is a 49 y.o. male who is coming in today for the above mentioned reasons. Past Medical History is significant for: Morbid obesity, gout, hypertension that has not been well controlled.  He is here today mainly for blood pressure follow-up.  He has been taking hydrochlorothiazide 25 mg daily and metoprolol 50 mg twice daily.  He has been working on a low-sodium diet.  He has no acute complaints today.   Past Medical/Surgical History: Past Medical History:  Diagnosis Date  . Gout 03/25/2019   Uric acid 9.0 January 2021.  Starting allopurinol.  Prescribed colchicine for gout prophylaxis for the first 1-3 months.  . Hypertension   . Morbid obesity ([place])     No past surgical history on file.  Social History:  reports that he has never smoked. He has never used smokeless tobacco. He reports current alcohol use of about 21.0 standard drinks of alcohol per week. He reports that he does not use drugs.  Allergies: Allergies  Allergen Reactions  . Lisinopril     Family History:  Family History  Problem Relation Age of Onset  . Hypertension Mother   . Diabetes Mother   . Hypertension Father   . Diabetes Father   . Diabetes Brother      Current Outpatient Medications:  .  allopurinol (ZYLOPRIM) 300 MG tablet, Take 1 tablet (300 mg total) by mouth daily. To lower uric acid to prevent gout long-term., Disp: 90 tablet, Rfl: 3 .  hydrochlorothiazide (HYDRODIURIL) 25 MG tablet, Take 1 tablet (25 mg total) by mouth daily., Disp: 90 tablet, Rfl: 1 .  metoprolol tartrate  (LOPRESSOR) 50 MG tablet, Take 1 tablet (50 mg total) by mouth 2 (two) times daily., Disp: 180 tablet, Rfl: 1 .  TURMERIC PO, Take 1 tablet by mouth daily., Disp: , Rfl:  .  amLODipine (NORVASC) 5 MG tablet, Take 1 tablet (5 mg total) by mouth daily., Disp: 90 tablet, Rfl: 1  Review of Systems:  Constitutional: Denies fever, chills, diaphoresis, appetite change and fatigue.  HEENT: Denies photophobia, eye pain, redness, hearing loss, ear pain, congestion, sore throat, rhinorrhea, sneezing, mouth sores, trouble swallowing, neck pain, neck stiffness and tinnitus.   Respiratory: Denies SOB, DOE, cough, chest tightness,  and wheezing.   Cardiovascular: Denies chest pain, palpitations and leg swelling.  Gastrointestinal: Denies nausea, vomiting, abdominal pain, diarrhea, constipation, blood in stool and abdominal distention.  Genitourinary: Denies dysuria, urgency, frequency, hematuria, flank pain and difficulty urinating.  Endocrine: Denies: hot or cold intolerance, sweats, changes in hair or nails, polyuria, polydipsia. Musculoskeletal: Denies myalgias, back pain, joint swelling, arthralgias and gait problem.  Skin: Denies pallor, rash and wound.  Neurological: Denies dizziness, seizures, syncope, weakness, light-headedness, numbness and headaches.  Hematological: Denies adenopathy. Easy bruising, personal or family bleeding history  Psychiatric/Behavioral: Denies suicidal ideation, mood changes, confusion, nervousness, sleep disturbance and agitation    Physical Exam: Vitals:   02/10/20 0905  BP: 130/90  Pulse: 97  Temp: 97.7 F (36.5 C)  TempSrc: Oral  SpO2: 97%  Weight: (!) 349 lb 1.6 oz (158.4 kg)    Body mass  index is 46.06 kg/m.   Constitutional: NAD, calm, comfortable Eyes: PERRL, lids and conjunctivae normal ENMT: Mucous membranes are moist. Respiratory: clear to auscultation bilaterally, no wheezing, no crackles. Normal respiratory effort. No accessory muscle use.   Cardiovascular: Regular rate and rhythm, no murmurs / rubs / gallops. No extremity edema. Neurologic: Grossly intact and nonfocal. Psychiatric: Normal judgment and insight. Alert and oriented x 3. Normal mood.    Impression and Plan:  Primary hypertension  -Still not well controlled although improved from prior. -Continue hydrochlorothiazide 25 daily and metoprolol 50 mg twice daily. -Add amlodipine 5 mg daily. -Continue to work on low-sodium diet. -Continue ambulatory blood pressure monitoring and return in 8 weeks for follow-up.  Gout of left foot, unspecified cause, unspecified chronicity -No recent flareups, on allopurinol.  Morbid obesity ([place]) -Discussed healthy lifestyle, including increased physical activity and better food choices to promote weight loss.    Patient Instructions  -Nice seeing you today!!  -Start amlodipine 5 mg daily.  -Continue all other medications as you are.  -Schedule follow up in 8 weeks.     Lelon Frohlich, MD [facility] at [facility]

## 2020-02-10 NOTE — Patient Instructions (Signed)
-  Nice seeing you today!!  -Start amlodipine 5 mg daily.  -Continue all other medications as you are.  -Schedule follow up in 8 weeks.

## 2020-03-26 ENCOUNTER — Other Ambulatory Visit: Payer: Self-pay | Admitting: Internal Medicine

## 2020-03-26 DIAGNOSIS — I10 Essential (primary) hypertension: Secondary | ICD-10-CM

## 2020-03-27 ENCOUNTER — Telehealth: Payer: Self-pay | Admitting: Internal Medicine

## 2020-03-27 ENCOUNTER — Other Ambulatory Visit: Payer: Self-pay | Admitting: *Deleted

## 2020-03-27 DIAGNOSIS — I10 Essential (primary) hypertension: Secondary | ICD-10-CM

## 2020-03-27 MED ORDER — HYDROCHLOROTHIAZIDE 25 MG PO TABS: 25.0000 mg | ORAL_TABLET | Freq: Every day | ORAL | 1 refills | Status: DC

## 2020-03-27 MED ORDER — METOPROLOL TARTRATE 50 MG PO TABS: 50.0000 mg | ORAL_TABLET | Freq: Two times a day (BID) | ORAL | 1 refills | Status: DC

## 2020-03-27 NOTE — Telephone Encounter (Signed)
Refills sent

## 2020-03-27 NOTE — Telephone Encounter (Signed)
Pt is calling in needing a refill on Rx's hydrochlorothiazide (HYDRODIURIL) 25 MG and metoprolol tartrate (LOPRESSOR) 50 MG  Pharm:  [facility] on [address]

## 2020-04-02 ENCOUNTER — Other Ambulatory Visit: Payer: Self-pay | Admitting: Family Medicine

## 2020-04-02 NOTE — Telephone Encounter (Signed)
Rx refill request approved per Dr. Corey's orders. 

## 2020-04-13 ENCOUNTER — Ambulatory Visit: Payer: 59 | Admitting: Internal Medicine

## 2020-04-19 ENCOUNTER — Other Ambulatory Visit: Payer: Self-pay

## 2020-04-20 ENCOUNTER — Ambulatory Visit (INDEPENDENT_AMBULATORY_CARE_PROVIDER_SITE_OTHER): Payer: 59 | Admitting: Internal Medicine

## 2020-04-20 ENCOUNTER — Encounter: Payer: Self-pay | Admitting: Internal Medicine

## 2020-04-20 VITALS — BP 130/80 | HR 92 | Temp 97.7°F | Wt 362.1 lb

## 2020-04-20 DIAGNOSIS — I10 Essential (primary) hypertension: Secondary | ICD-10-CM | POA: Diagnosis not present

## 2020-04-20 NOTE — Progress Notes (Signed)
Established Patient Office Visit     This visit occurred during the SARS-CoV-2 public health emergency.  Safety protocols were in place, including screening questions prior to the visit, additional usage of staff PPE, and extensive cleaning of exam room while observing appropriate contact time as indicated for disinfecting solutions.    CC/Reason for Visit: Blood pressure follow-up  HPI: Mike [name] is a 50 y.o. male who is coming in today for the above mentioned reasons. Past Medical History is significant for: Hypertension that has not been well controlled, morbid obesity and gout.  He is here today for blood pressure follow-up after adjustment of medication 8 weeks ago.  He is currently on hydrochlorothiazide 25 mg daily, Norvasc 5 mg daily and metoprolol 50 mg twice daily.  He is tolerating medications well and has been compliant.  He has no acute complaints.   Past Medical/Surgical History: Past Medical History:  Diagnosis Date  . Gout 03/25/2019   Uric acid 9.0 January 2021.  Starting allopurinol.  Prescribed colchicine for gout prophylaxis for the first 1-3 months.  . Hypertension   . Morbid obesity ([place])     No past surgical history on file.  Social History:  reports that he has never smoked. He has never used smokeless tobacco. He reports current alcohol use of about 21.0 standard drinks of alcohol per week. He reports that he does not use drugs.  Allergies: Allergies  Allergen Reactions  . Lisinopril     Family History:  Family History  Problem Relation Age of Onset  . Hypertension Mother   . Diabetes Mother   . Hypertension Father   . Diabetes Father   . Diabetes Brother      Current Outpatient Medications:  .  allopurinol (ZYLOPRIM) 300 MG tablet, TAKE 1 TABLET (300 MG TOTAL) BY MOUTH DAILY. TO LOWER URIC ACID TO PREVENT GOUT LONG-TERM., Disp: 90 tablet, Rfl: 3 .  amLODipine (NORVASC) 5 MG tablet, Take 1 tablet (5 mg total) by mouth daily., Disp:  90 tablet, Rfl: 1 .  hydrochlorothiazide (HYDRODIURIL) 25 MG tablet, Take 1 tablet (25 mg total) by mouth daily., Disp: 90 tablet, Rfl: 1 .  metoprolol tartrate (LOPRESSOR) 50 MG tablet, Take 1 tablet (50 mg total) by mouth 2 (two) times daily., Disp: 180 tablet, Rfl: 1 .  TURMERIC PO, Take 1 tablet by mouth daily., Disp: , Rfl:   Review of Systems:  Constitutional: Denies fever, chills, diaphoresis, appetite change and fatigue.  HEENT: Denies photophobia, eye pain, redness, hearing loss, ear pain, congestion, sore throat, rhinorrhea, sneezing, mouth sores, trouble swallowing, neck pain, neck stiffness and tinnitus.   Respiratory: Denies SOB, DOE, cough, chest tightness,  and wheezing.   Cardiovascular: Denies chest pain, palpitations and leg swelling.  Gastrointestinal: Denies nausea, vomiting, abdominal pain, diarrhea, constipation, blood in stool and abdominal distention.  Genitourinary: Denies dysuria, urgency, frequency, hematuria, flank pain and difficulty urinating.  Endocrine: Denies: hot or cold intolerance, sweats, changes in hair or nails, polyuria, polydipsia. Musculoskeletal: Denies myalgias, back pain, joint swelling, arthralgias and gait problem.  Skin: Denies pallor, rash and wound.  Neurological: Denies dizziness, seizures, syncope, weakness, light-headedness, numbness and headaches.  Hematological: Denies adenopathy. Easy bruising, personal or family bleeding history  Psychiatric/Behavioral: Denies suicidal ideation, mood changes, confusion, nervousness, sleep disturbance and agitation    Physical Exam: Vitals:   04/20/20 0959  BP: 130/80  Pulse: 92  Temp: 97.7 F (36.5 C)  TempSrc: Oral  SpO2: 98%  Weight: Marland Kitchen)  362 lb 1.6 oz (164.2 kg)    Body mass index is 47.77 kg/m.   Constitutional: NAD, calm, comfortable, morbidly obese Eyes: PERRL, lids and conjunctivae normal ENMT: Mucous membranes are moist. Respiratory: clear to auscultation bilaterally, no wheezing,  no crackles. Normal respiratory effort. No accessory muscle use.  Cardiovascular: Regular rate and rhythm, no murmurs / rubs / gallops. No extremity edema.   Neurologic: Grossly intact and nonfocal.  Psychiatric: Normal judgment and insight. Alert and oriented x 3. Normal mood.    Impression and Plan:  Primary hypertension -Well-controlled on current medications as listed on HPI section. -Follow-up in 3 to 4 months.  Morbid obesity ([place]) -Discussed healthy lifestyle, including increased physical activity and better food choices to promote weight loss.    Patient Instructions  -Nice seeing you today!!  -Schedule your physical in 4 months. Please come in fasting that day.       Lelon Frohlich, MD [facility] at [facility]

## 2020-04-20 NOTE — Patient Instructions (Signed)
-  Nice seeing you today!!  -Schedule your physical in 4 months. Please come in fasting that day.

## 2020-07-30 ENCOUNTER — Other Ambulatory Visit: Payer: Self-pay | Admitting: Internal Medicine

## 2020-07-30 DIAGNOSIS — I10 Essential (primary) hypertension: Secondary | ICD-10-CM

## 2020-08-16 ENCOUNTER — Other Ambulatory Visit: Payer: Self-pay

## 2020-08-17 ENCOUNTER — Encounter: Payer: Self-pay | Admitting: Internal Medicine

## 2020-08-17 ENCOUNTER — Ambulatory Visit (INDEPENDENT_AMBULATORY_CARE_PROVIDER_SITE_OTHER): Payer: 59 | Admitting: Internal Medicine

## 2020-08-17 VITALS — BP 130/80 | HR 87 | Temp 97.7°F | Ht 73.0 in | Wt 358.6 lb

## 2020-08-17 DIAGNOSIS — I10 Essential (primary) hypertension: Secondary | ICD-10-CM | POA: Diagnosis not present

## 2020-08-17 DIAGNOSIS — M109 Gout, unspecified: Secondary | ICD-10-CM

## 2020-08-17 DIAGNOSIS — Z0000 Encounter for general adult medical examination without abnormal findings: Secondary | ICD-10-CM

## 2020-08-17 DIAGNOSIS — Z125 Encounter for screening for malignant neoplasm of prostate: Secondary | ICD-10-CM | POA: Diagnosis not present

## 2020-08-17 DIAGNOSIS — Z1211 Encounter for screening for malignant neoplasm of colon: Secondary | ICD-10-CM

## 2020-08-17 LAB — COMPREHENSIVE METABOLIC PANEL
ALT: 33 U/L (ref 0–53)
AST: 24 U/L (ref 0–37)
Albumin: 4.3 g/dL (ref 3.5–5.2)
Alkaline Phosphatase: 69 U/L (ref 39–117)
BUN: 15 mg/dL (ref 6–23)
CO2: 26 mEq/L (ref 19–32)
Calcium: 9.8 mg/dL (ref 8.4–10.5)
Chloride: 100 mEq/L (ref 96–112)
Creatinine, Ser: 0.87 mg/dL (ref 0.40–1.50)
GFR: 101.5 mL/min (ref >60.00)
Glucose, Bld: 95 mg/dL (ref 70–99)
Potassium: 4 mEq/L (ref 3.5–5.1)
Sodium: 136 mEq/L (ref 135–145)
Total Bilirubin: 0.5 mg/dL (ref 0.2–1.2)
Total Protein: 7 g/dL (ref 6.0–8.3)

## 2020-08-17 LAB — LIPID PANEL
Cholesterol: 184 mg/dL (ref 0–200)
HDL: 45.7 mg/dL (ref >39.00)
LDL Cholesterol: 108 mg/dL — ABNORMAL HIGH (ref 0–99)
NonHDL: 138.55
Total CHOL/HDL Ratio: 4
Triglycerides: 152 mg/dL — ABNORMAL HIGH (ref 0.0–149.0)
VLDL: 30.4 mg/dL (ref 0.0–40.0)

## 2020-08-17 LAB — CBC WITH DIFFERENTIAL/PLATELET
Basophils Absolute: 0 10*3/uL (ref 0.0–0.1)
Basophils Relative: 0.7 % (ref 0.0–3.0)
Eosinophils Absolute: 0.1 10*3/uL (ref 0.0–0.7)
Eosinophils Relative: 1.8 % (ref 0.0–5.0)
HCT: 43.7 % (ref 39.0–52.0)
Hemoglobin: 14.4 g/dL (ref 13.0–17.0)
Lymphocytes Relative: 32.1 % (ref 12.0–46.0)
Lymphs Abs: 2 10*3/uL (ref 0.7–4.0)
MCHC: 33.1 g/dL (ref 30.0–36.0)
MCV: 88.3 fl (ref 78.0–100.0)
Monocytes Absolute: 0.4 10*3/uL (ref 0.1–1.0)
Monocytes Relative: 7.1 % (ref 3.0–12.0)
Neutro Abs: 3.6 10*3/uL (ref 1.4–7.7)
Neutrophils Relative %: 58.3 % (ref 43.0–77.0)
Platelets: 247 10*3/uL (ref 150.0–400.0)
RBC: 4.94 Mil/uL (ref 4.22–5.81)
RDW: 14.3 % (ref 11.5–15.5)
WBC: 6.1 10*3/uL (ref 4.0–10.5)

## 2020-08-17 LAB — PSA: PSA: 0.57 ng/mL (ref 0.10–4.00)

## 2020-08-17 LAB — HEMOGLOBIN A1C: Hgb A1c MFr Bld: 6 % (ref 4.6–6.5)

## 2020-08-17 LAB — VITAMIN B12: Vitamin B-12: 326 pg/mL (ref 211–911)

## 2020-08-17 LAB — TSH: TSH: 3.58 u[IU]/mL (ref 0.35–4.50)

## 2020-08-17 LAB — VITAMIN D 25 HYDROXY (VIT D DEFICIENCY, FRACTURES): VITD: 24.45 ng/mL — ABNORMAL LOW (ref 30.00–100.00)

## 2020-08-17 NOTE — Addendum Note (Signed)
Addended by: Geradine Girt D on: 08/17/2020 10:03 AM   Modules accepted: Orders

## 2020-08-17 NOTE — Addendum Note (Signed)
Addended by: Erline Hau on: 08/17/2020 09:59 AM   Modules accepted: Orders

## 2020-08-17 NOTE — Patient Instructions (Signed)
-  Nice seeing you today!!  -Lab work today; will notify you once results are available.  -Remember to schedule your eye and dental exams.  -Referral for colonoscopy will be made today.  -Schedule follow up in 6 months.

## 2020-08-17 NOTE — Progress Notes (Signed)
Established Patient Office Visit     This visit occurred during the SARS-CoV-2 public health emergency.  Safety protocols were in place, including screening questions prior to the visit, additional usage of staff PPE, and extensive cleaning of exam room while observing appropriate contact time as indicated for disinfecting solutions.    CC/Reason for Visit: Annual preventive exam  HPI: Mike [name] is a 50 y.o. male who is coming in today for the above mentioned reasons. Past Medical History is significant for: Hypertension, gout, morbid obesity.  He has been doing well and has no acute complaints.  He has lost 4 pounds since his last visit.  He has had 4 COVID vaccines, he is overdue for his initial screening colonoscopy.   Past Medical/Surgical History: Past Medical History:  Diagnosis Date   Gout 03/25/2019   Uric acid 9.0 January 2021.  Starting allopurinol.  Prescribed colchicine for gout prophylaxis for the first 1-3 months.   Hypertension    Morbid obesity ([place])     No past surgical history on file.  Social History:  reports that he has never smoked. He has never used smokeless tobacco. He reports current alcohol use of about 21.0 standard drinks of alcohol per week. He reports that he does not use drugs.  Allergies: Allergies  Allergen Reactions   Lisinopril     Family History:  Family History  Problem Relation Age of Onset   Hypertension Mother    Diabetes Mother    Hypertension Father    Diabetes Father    Diabetes Brother      Current Outpatient Medications:    allopurinol (ZYLOPRIM) 300 MG tablet, TAKE 1 TABLET (300 MG TOTAL) BY MOUTH DAILY. TO LOWER URIC ACID TO PREVENT GOUT LONG-TERM., Disp: 90 tablet, Rfl: 3   amLODipine (NORVASC) 5 MG tablet, TAKE 1 TABLET (5 MG TOTAL) BY MOUTH DAILY., Disp: 90 tablet, Rfl: 1   hydrochlorothiazide (HYDRODIURIL) 25 MG tablet, Take 1 tablet (25 mg total) by mouth daily., Disp: 90 tablet, Rfl: 1   metoprolol  tartrate (LOPRESSOR) 50 MG tablet, Take 1 tablet (50 mg total) by mouth 2 (two) times daily., Disp: 180 tablet, Rfl: 1   TURMERIC PO, Take 1 tablet by mouth daily. (Patient not taking: Reported on 08/17/2020), Disp: , Rfl:   Review of Systems:  Constitutional: Denies fever, chills, diaphoresis, appetite change and fatigue.  HEENT: Denies photophobia, eye pain, redness, hearing loss, ear pain, congestion, sore throat, rhinorrhea, sneezing, mouth sores, trouble swallowing, neck pain, neck stiffness and tinnitus.   Respiratory: Denies SOB, DOE, cough, chest tightness,  and wheezing.   Cardiovascular: Denies chest pain, palpitations and leg swelling.  Gastrointestinal: Denies nausea, vomiting, abdominal pain, diarrhea, constipation, blood in stool and abdominal distention.  Genitourinary: Denies dysuria, urgency, frequency, hematuria, flank pain and difficulty urinating.  Endocrine: Denies: hot or cold intolerance, sweats, changes in hair or nails, polyuria, polydipsia. Musculoskeletal: Denies myalgias, back pain, joint swelling, arthralgias and gait problem.  Skin: Denies pallor, rash and wound.  Neurological: Denies dizziness, seizures, syncope, weakness, light-headedness, numbness and headaches.  Hematological: Denies adenopathy. Easy bruising, personal or family bleeding history  Psychiatric/Behavioral: Denies suicidal ideation, mood changes, confusion, nervousness, sleep disturbance and agitation    Physical Exam: Vitals:   08/17/20 0927  BP: 130/80  Pulse: 87  Temp: 97.7 F (36.5 C)  TempSrc: Oral  SpO2: 97%  Weight: (!) 358 lb 9.6 oz (162.7 kg)  Height: 6\' 1"  (1.854 m)    Body  mass index is 47.31 kg/m.   Constitutional: NAD, calm, comfortable, morbidly obese Eyes: PERRL, lids and conjunctivae normal ENMT: Mucous membranes are moist. Posterior pharynx clear of any exudate or lesions. Normal dentition. Tympanic membrane is pearly white, no erythema or bulging. Neck: normal,  supple, no masses, no thyromegaly Respiratory: clear to auscultation bilaterally, no wheezing, no crackles. Normal respiratory effort. No accessory muscle use.  Cardiovascular: Regular rate and rhythm, no murmurs / rubs / gallops. No extremity edema. 2+ pedal pulses. No carotid bruits.  Abdomen: no tenderness, no masses palpated. No hepatosplenomegaly. Bowel sounds positive.  Musculoskeletal: no clubbing / cyanosis. No joint deformity upper and lower extremities. Good ROM, no contractures. Normal muscle tone.  Skin: no rashes, lesions, ulcers. No induration Neurologic: CN 2-12 grossly intact. Sensation intact, DTR normal. Strength 5/5 in all 4.  Psychiatric: Normal judgment and insight. Alert and oriented x 3. Normal mood.    Impression and Plan:  Encounter for preventive health examination  -Advised routine eye and dental care. -All immunizations are up-to-date. -Screening labs today. -Healthy lifestyle discussed in detail. -Refer to GI for initial screening colonoscopy.  Primary hypertension  -Well-controlled on amlodipine 5 mg, hydrochlorothiazide 25 mg and metoprolol 50 mg twice daily.  - Plan: CBC with Differential/Platelet, Comprehensive metabolic panel, Lipid panel  Morbid obesity ([facility]) -Discussed healthy lifestyle, including increased physical activity and better food choices to promote weight loss.  Gout of left foot, unspecified cause, unspecified chronicity -Stable, no recent flareups on allopurinol.    Patient Instructions  -Nice seeing you today!!  -Lab work today; will notify you once results are available.  -Remember to schedule your eye and dental exams.  -Referral for colonoscopy will be made today.  -Schedule follow up in 6 months.      Lelon Frohlich, MD [facility] at [facility]

## 2020-08-17 NOTE — Addendum Note (Signed)
Addended by: Amanda Cockayne on: 08/17/2020 10:07 AM   Modules accepted: Orders

## 2020-08-21 ENCOUNTER — Other Ambulatory Visit: Payer: Self-pay | Admitting: Internal Medicine

## 2020-08-21 ENCOUNTER — Encounter: Payer: Self-pay | Admitting: Internal Medicine

## 2020-08-21 DIAGNOSIS — R7302 Impaired glucose tolerance (oral): Secondary | ICD-10-CM | POA: Insufficient documentation

## 2020-08-21 DIAGNOSIS — E559 Vitamin D deficiency, unspecified: Secondary | ICD-10-CM

## 2020-08-21 MED ORDER — VITAMIN D (ERGOCALCIFEROL) 1.25 MG (50000 UNIT) PO CAPS
50000.0000 [IU] | ORAL_CAPSULE | ORAL | 0 refills | Status: AC
Start: 2020-08-21 — End: 2020-11-07

## 2020-08-22 ENCOUNTER — Encounter: Payer: Self-pay | Admitting: Gastroenterology

## 2020-09-21 ENCOUNTER — Other Ambulatory Visit: Payer: Self-pay

## 2020-09-21 ENCOUNTER — Ambulatory Visit (AMBULATORY_SURGERY_CENTER): Payer: 59 | Admitting: *Deleted

## 2020-09-21 VITALS — Ht 73.0 in | Wt 350.0 lb

## 2020-09-21 DIAGNOSIS — Z1211 Encounter for screening for malignant neoplasm of colon: Secondary | ICD-10-CM

## 2020-09-21 NOTE — Progress Notes (Signed)
Pt's previsit is done over the phone and all paperwork (prep instructions, blank consent form to just read over) sent to patient.  Pt's name and DOB verified at the beginning of the previsit.  Pt denies any difficulty with ambulating.   Pt denies trouble moving neck   No egg or soy allergy  No home oxygen use   No medications for weight loss taken  emmi information given  Pt denies constipation issues

## 2020-10-10 ENCOUNTER — Encounter: Payer: Self-pay | Admitting: Gastroenterology

## 2020-10-12 ENCOUNTER — Other Ambulatory Visit: Payer: Self-pay

## 2020-10-12 ENCOUNTER — Ambulatory Visit (AMBULATORY_SURGERY_CENTER): Payer: 59 | Admitting: Gastroenterology

## 2020-10-12 ENCOUNTER — Encounter: Payer: Self-pay | Admitting: Gastroenterology

## 2020-10-12 VITALS — BP 128/77 | HR 68 | Temp 96.0°F | Resp 18 | Ht 73.0 in | Wt 350.0 lb

## 2020-10-12 DIAGNOSIS — Z1211 Encounter for screening for malignant neoplasm of colon: Secondary | ICD-10-CM | POA: Diagnosis not present

## 2020-10-12 DIAGNOSIS — D127 Benign neoplasm of rectosigmoid junction: Secondary | ICD-10-CM | POA: Diagnosis not present

## 2020-10-12 DIAGNOSIS — K635 Polyp of colon: Secondary | ICD-10-CM

## 2020-10-12 MED ORDER — SODIUM CHLORIDE 0.9 % IV SOLN: 500.0000 mL | Freq: Once | INTRAVENOUS | Status: DC

## 2020-10-12 NOTE — Progress Notes (Signed)
PT taken to PACU. Monitors in place. VSS. Report given to RN. 

## 2020-10-12 NOTE — Progress Notes (Signed)
Pt's states no medical or surgical changes since previsit or office visit.   DT vitals and MO IV.

## 2020-10-12 NOTE — Patient Instructions (Signed)
1 polyp removed and sent to pathology.  Hemorrhoids.  High fiber diet and daily FiberCon 1-2 tablets recommended.    Resume previous medications.  Await pathology for final recommendations.  Handouts on findings given to patient (polyps, hemorrhoids and high fiber diet).     YOU HAD AN ENDOSCOPIC PROCEDURE TODAY AT [facility]:   Refer to the procedure report that was given to you for any specific questions about what was found during the examination.  If the procedure report does not answer your questions, please call your gastroenterologist to clarify.  If you requested that your care partner not be given the details of your procedure findings, then the procedure report has been included in a sealed envelope for you to review at your convenience later.  YOU SHOULD EXPECT: Some feelings of bloating in the abdomen. Passage of more gas than usual.  Walking can help get rid of the air that was put into your GI tract during the procedure and reduce the bloating. If you had a lower endoscopy (such as a colonoscopy or flexible sigmoidoscopy) you may notice spotting of blood in your stool or on the toilet paper. If you underwent a bowel prep for your procedure, you may not have a normal bowel movement for a few days.  Please Note:  You might notice some irritation and congestion in your nose or some drainage.  This is from the oxygen used during your procedure.  There is no need for concern and it should clear up in a day or so.  SYMPTOMS TO REPORT IMMEDIATELY:  Following lower endoscopy (colonoscopy or flexible sigmoidoscopy):  Excessive amounts of blood in the stool  Significant tenderness or worsening of abdominal pains  Swelling of the abdomen that is new, acute  Fever of 100F or higher   For urgent or emergent issues, a gastroenterologist can be reached at any hour by calling [phone]. Do not use MyChart messaging for urgent concerns.    DIET:  We do recommend a small  meal at first, but then you may proceed to your regular diet.  Drink plenty of fluids but you should avoid alcoholic beverages for 24 hours.  ACTIVITY:  You should plan to take it easy for the rest of today and you should NOT DRIVE or use heavy machinery until tomorrow (because of the sedation medicines used during the test).    FOLLOW UP: Our staff will call the number listed on your records 48-72 hours following your procedure to check on you and address any questions or concerns that you may have regarding the information given to you following your procedure. If we do not reach you, we will leave a message.  We will attempt to reach you two times.  During this call, we will ask if you have developed any symptoms of COVID 19. If you develop any symptoms (ie: fever, flu-like symptoms, shortness of breath, cough etc.) before then, please call [phone].  If you test positive for Covid 19 in the 2 weeks post procedure, please call and report this information to [place].    If any biopsies were taken you will be contacted by phone or by letter within the next 1-3 weeks.  Please call us at [phone] if you have not heard about the biopsies in 3 weeks.    SIGNATURES/CONFIDENTIALITY: You and/or your care partner have signed paperwork which will be entered into your electronic medical record.  These signatures attest to the fact that that the  information above on your After Visit Summary has been reviewed and is understood.  Full responsibility of the confidentiality of this discharge information lies with you and/or your care-partner.

## 2020-10-12 NOTE — Op Note (Signed)
[facility] Patient Name: Mike [name] Procedure Date: 10/12/2020 2:32 PM MRN: [id number] Endoscopist: Gabriel Mansouraty , MD Age: 50 Referring MD:  Date of Birth: 04/03/1970 Gender: Male Account #: [id number] Procedure:                Colonoscopy Indications:              Screening for colorectal malignant neoplasm, This                            is the patient's first colonoscopy Medicines:                Monitored Anesthesia Care Procedure:                Pre-Anesthesia Assessment:                           - Prior to the procedure, a History and Physical                            was performed, and patient medications and                            allergies were reviewed. The patient's tolerance of                            previous anesthesia was also reviewed. The risks                            and benefits of the procedure and the sedation                            options and risks were discussed with the patient.                            All questions were answered, and informed consent                            was obtained. Prior Anticoagulants: The patient has                            taken no previous anticoagulant or antiplatelet                            agents. ASA Grade Assessment: III - A patient with                            severe systemic disease. After reviewing the risks                            and benefits, the patient was deemed in                            satisfactory condition to undergo the procedure.                             After obtaining informed consent, the colonoscope                            was passed under direct vision. Throughout the                            procedure, the patient's blood pressure, pulse, and                            oxygen saturations were monitored continuously. The                            Colonoscope was introduced through the anus and                            advanced to the 5 cm  into the ileum. The                            colonoscopy was performed without difficulty. The                            patient tolerated the procedure. The quality of the                            bowel preparation was adequate. The terminal ileum,                            ileocecal valve, appendiceal orifice, and rectum                            were photographed. Scope In: 2:54:08 PM Scope Out: 3:12:51 PM Scope Withdrawal Time: 0 hours 14 minutes 59 seconds  Total Procedure Duration: 0 hours 18 minutes 43 seconds  Findings:                 The digital rectal exam findings include                            hemorrhoids. Pertinent negatives include no                            palpable rectal lesions.                           The terminal ileum and ileocecal valve appeared                            normal.                           A 6 mm polyp was found in the recto-sigmoid colon.                            The polyp was sessile. The polyp was removed with a  cold snare. Resection and retrieval were complete.                           Normal mucosa was found in the entire colon                            otherwise.                           Non-bleeding non-thrombosed internal hemorrhoids                            were found during retroflexion, during perianal                            exam and during digital exam. The hemorrhoids were                            Grade II (internal hemorrhoids that prolapse but                            reduce spontaneously). Complications:            No immediate complications. Estimated Blood Loss:     Estimated blood loss was minimal. Impression:               - Hemorrhoids found on digital rectal exam.                           - The examined portion of the ileum was normal.                           - One 6 mm polyp at the recto-sigmoid colon,                            removed with a cold snare. Resected  and retrieved.                           - Normal mucosa in the entire examined colon                            otherwise.                           - Non-bleeding non-thrombosed internal hemorrhoids. Recommendation:           - The patient will be observed post-procedure,                            until all discharge criteria are met.                           - Discharge patient to home.                           - Patient has a contact number available for  emergencies. The signs and symptoms of potential                            delayed complications were discussed with the                            patient. Return to normal activities tomorrow.                            Written discharge instructions were provided to the                            patient.                           - High fiber diet.                           - Use FiberCon 1-2 tablets PO daily.                           - Continue present medications.                           - Await pathology results.                           - Repeat colonoscopy in 06/30/08 years for                            surveillance based on pathology results and                            findings of adenomatous tissue.                           - The findings and recommendations were discussed                            with the patient.                           - The findings and recommendations were discussed                            with the patient's family. Gabriel Mansouraty, MD 10/12/2020 3:18:08 PM 

## 2020-10-12 NOTE — Progress Notes (Signed)
Called to room to assist during endoscopic procedure.  Patient ID and intended procedure confirmed with present staff. Received instructions for my participation in the procedure from the performing physician.  

## 2020-10-12 NOTE — Progress Notes (Signed)
GASTROENTEROLOGY PROCEDURE H&P NOTE   Primary Care Physician: Isaac Bliss, Rayford Halsted, MD  HPI: Mike [name] is a 50 y.o. male who presents for Colonoscopy for screening.  Past Medical History:  Diagnosis Date   Gout 03/25/2019   Uric acid 9.0 January 2021.  Starting allopurinol.  Prescribed colchicine for gout prophylaxis for the first 1-3 months.   Hypertension    Morbid obesity ([place])    MRSA infection    11 years ago   History reviewed. No pertinent surgical history. Current Outpatient Medications  Medication Sig Dispense Refill   allopurinol (ZYLOPRIM) 300 MG tablet TAKE 1 TABLET (300 MG TOTAL) BY MOUTH DAILY. TO LOWER URIC ACID TO PREVENT GOUT LONG-TERM. 90 tablet 3   amLODipine (NORVASC) 5 MG tablet TAKE 1 TABLET (5 MG TOTAL) BY MOUTH DAILY. 90 tablet 1   hydrochlorothiazide (HYDRODIURIL) 25 MG tablet Take 1 tablet (25 mg total) by mouth daily. 90 tablet 1   metoprolol tartrate (LOPRESSOR) 50 MG tablet Take 1 tablet (50 mg total) by mouth 2 (two) times daily. 180 tablet 1   Vitamin D, Ergocalciferol, (DRISDOL) 1.25 MG (50000 UNIT) CAPS capsule Take 1 capsule (50,000 Units total) by mouth every 7 (seven) days for 12 doses. 12 capsule 0   TURMERIC PO Take 1 tablet by mouth daily. (Patient not taking: No sig reported)     Current Facility-Administered Medications  Medication Dose Route Frequency Provider Last Rate Last Admin   0.9 %  sodium chloride infusion  500 mL Intravenous Once Mansouraty, Telford Nab., MD        Current Outpatient Medications:    allopurinol (ZYLOPRIM) 300 MG tablet, TAKE 1 TABLET (300 MG TOTAL) BY MOUTH DAILY. TO LOWER URIC ACID TO PREVENT GOUT LONG-TERM., Disp: 90 tablet, Rfl: 3   amLODipine (NORVASC) 5 MG tablet, TAKE 1 TABLET (5 MG TOTAL) BY MOUTH DAILY., Disp: 90 tablet, Rfl: 1   hydrochlorothiazide (HYDRODIURIL) 25 MG tablet, Take 1 tablet (25 mg total) by mouth daily., Disp: 90 tablet, Rfl: 1   metoprolol tartrate (LOPRESSOR) 50 MG  tablet, Take 1 tablet (50 mg total) by mouth 2 (two) times daily., Disp: 180 tablet, Rfl: 1   Vitamin D, Ergocalciferol, (DRISDOL) 1.25 MG (50000 UNIT) CAPS capsule, Take 1 capsule (50,000 Units total) by mouth every 7 (seven) days for 12 doses., Disp: 12 capsule, Rfl: 0   TURMERIC PO, Take 1 tablet by mouth daily. (Patient not taking: No sig reported), Disp: , Rfl:   Current Facility-Administered Medications:    0.9 %  sodium chloride infusion, 500 mL, Intravenous, Once, Mansouraty, Telford Nab., MD Allergies  Allergen Reactions   Lisinopril     Swelling around eyes   Family History  Problem Relation Age of Onset   Hypertension Mother    Diabetes Mother    Hypertension Father    Diabetes Father    Diabetes Brother    Colon cancer Neg Hx    Esophageal cancer Neg Hx    Rectal cancer Neg Hx    Stomach cancer Neg Hx    Social History   Socioeconomic History   Marital status: Married    Spouse name: Not on file   Number of children: Not on file   Years of education: Not on file   Highest education level: Not on file  Occupational History   Not on file  Tobacco Use   Smoking status: Former    Types: Cigarettes   Smokeless tobacco: Never   Tobacco comments:  Greater than 10 years ago  Vaping Use   Vaping Use: Never used  Substance and Sexual Activity   Alcohol use: Yes    Alcohol/week: 21.0 standard drinks    Types: 21 Cans of beer per week   Drug use: Not Currently    Comment: greater than 10 years marijuana use   Sexual activity: Yes  Other Topics Concern   Not on file  Social History Narrative   Not on file   Social Determinants of Health   Financial Resource Strain: Not on file  Food Insecurity: Not on file  Transportation Needs: Not on file  Physical Activity: Not on file  Stress: Not on file  Social Connections: Not on file  Intimate Partner Violence: Not on file    Physical Exam: Vital signs in last 24 hours: '@VSRANGES'$ @   GEN: NAD EYE: Sclerae  anicteric ENT: MMM CV: Non-tachycardic GI: Soft, NT/ND NEURO:  Alert & Oriented x 3  Lab Results: No results for input(s): WBC, HGB, HCT, PLT in the last 72 hours. BMET No results for input(s): NA, K, CL, CO2, GLUCOSE, BUN, CREATININE, CALCIUM in the last 72 hours. LFT No results for input(s): PROT, ALBUMIN, AST, ALT, ALKPHOS, BILITOT, BILIDIR, IBILI in the last 72 hours. PT/INR No results for input(s): LABPROT, INR in the last 72 hours.   Impression / Plan: This is a 50 y.o.male who presents for Colonoscopy for screening.  The risks and benefits of endoscopic evaluation/treatment were discussed with the patient and/or family; these include but are not limited to the risk of perforation, infection, bleeding, missed lesions, lack of diagnosis, severe illness requiring hospitalization, as well as anesthesia and sedation related illnesses.  The patient's history has been reviewed, patient examined, no change in status, and deemed stable for procedure.  The patient and/or family is agreeable to proceed.    Justice Britain, MD [facility] Office # [phone]

## 2020-10-16 ENCOUNTER — Telehealth: Payer: Self-pay | Admitting: *Deleted

## 2020-10-16 NOTE — Telephone Encounter (Signed)
Voicemail not set up.

## 2020-10-16 NOTE — Telephone Encounter (Signed)
Unable to leave message voicemail not set up. 

## 2020-10-17 ENCOUNTER — Encounter: Payer: Self-pay | Admitting: Gastroenterology

## 2020-11-09 ENCOUNTER — Encounter: Payer: Self-pay | Admitting: Family Medicine

## 2020-11-09 ENCOUNTER — Telehealth (INDEPENDENT_AMBULATORY_CARE_PROVIDER_SITE_OTHER): Payer: 59 | Admitting: Family Medicine

## 2020-11-09 DIAGNOSIS — U071 COVID-19: Secondary | ICD-10-CM | POA: Diagnosis not present

## 2020-11-09 NOTE — Progress Notes (Signed)
Virtual Visit via Video Note  I connected with Mike [name] on 11/09/20 at  4:00 PM EDT by a video enabled telemedicine application 2/2 [id number] pandemic and verified that I am speaking with the correct person using two identifiers.  Location patient: home Location provider:work or home office Persons participating in the virtual visit: patient, provider  I discussed the limitations of evaluation and management by telemedicine and the availability of in person appointments. The patient expressed understanding and agreed to proceed.  Chief Complaint  Patient presents with   Covid Positive    Home tested yesterday, symptoms of scratchy throat, slight congestion (nasal), slight loss of taste and smell started earlier in the week. Used vicks rub and took nyquil     HPI: Symptoms Sunday or Monday with scratchy throat, stuffiness/congestion, mild loss of taste, cough.  Pt states symptoms are like a mild cold.  Tried Nyquil.  Denies fever, HA, n/v.  Possible sick contacts include co-workers.  Pt states his energy is good and feels great, but has a mild change in his voice.  Pt fully vaccinated and boosted x 2.  Pt states he "feels wonderful".  ROS: See pertinent positives and negatives per HPI.  Past Medical History:  Diagnosis Date   Gout 03/25/2019   Uric acid 9.0 January 2021.  Starting allopurinol.  Prescribed colchicine for gout prophylaxis for the first 1-3 months.   Hypertension    Morbid obesity ([place])    MRSA infection    11 years ago    No past surgical history on file.  Family History  Problem Relation Age of Onset   Hypertension Mother    Diabetes Mother    Hypertension Father    Diabetes Father    Diabetes Brother    Colon cancer Neg Hx    Esophageal cancer Neg Hx    Rectal cancer Neg Hx    Stomach cancer Neg Hx     Current Outpatient Medications:    allopurinol (ZYLOPRIM) 300 MG tablet, TAKE 1 TABLET (300 MG TOTAL) BY MOUTH DAILY. TO LOWER URIC ACID TO PREVENT  GOUT LONG-TERM., Disp: 90 tablet, Rfl: 3   amLODipine (NORVASC) 5 MG tablet, TAKE 1 TABLET (5 MG TOTAL) BY MOUTH DAILY., Disp: 90 tablet, Rfl: 1   hydrochlorothiazide (HYDRODIURIL) 25 MG tablet, Take 1 tablet (25 mg total) by mouth daily., Disp: 90 tablet, Rfl: 1   metoprolol tartrate (LOPRESSOR) 50 MG tablet, Take 1 tablet (50 mg total) by mouth 2 (two) times daily., Disp: 180 tablet, Rfl: 1   TURMERIC PO, Take 1 tablet by mouth daily. (Patient not taking: No sig reported), Disp: , Rfl:   Current Facility-Administered Medications:    0.9 %  sodium chloride infusion, 500 mL, Intravenous, Once, Mansouraty, Telford Nab., MD  Jasmine DecemberTonette Bihari per patient if applicable: RR between 123456 bpm  GENERAL: alert, oriented, appears well and in no acute distress  HEENT: atraumatic, conjunctiva clear, no obvious abnormalities on inspection of external nose and ears  NECK: normal movements of the head and neck  LUNGS: on inspection no signs of respiratory distress, breathing rate appears normal, no obvious gross SOB, gasping or wheezing  CV: no obvious cyanosis  MS: moves all visible extremities without noticeable abnormality  PSYCH/NEURO: pleasant and cooperative, no obvious depression or anxiety, speech and thought processing grossly intact  ASSESSMENT AND PLAN:  Discussed the following assessment and plan:  COVID-19 virus infection -symptoms started 11/04/20 with positive test 11/08/20 -mild symptoms -continue supportive care -given precautions -  discussed antiviral med not indicated  f/u prn  I discussed the assessment and treatment plan with the patient. The patient was provided an opportunity to ask questions and all were answered. The patient agreed with the plan and demonstrated an understanding of the instructions.   The patient was advised to call back or seek an in-person evaluation if the symptoms worsen or if the condition fails to improve as anticipated.  Billie Ruddy, MD

## 2020-11-11 ENCOUNTER — Other Ambulatory Visit: Payer: Self-pay | Admitting: Internal Medicine

## 2020-11-11 DIAGNOSIS — E559 Vitamin D deficiency, unspecified: Secondary | ICD-10-CM

## 2020-11-12 ENCOUNTER — Other Ambulatory Visit: Payer: Self-pay | Admitting: Internal Medicine

## 2020-11-12 DIAGNOSIS — I10 Essential (primary) hypertension: Secondary | ICD-10-CM

## 2020-12-26 ENCOUNTER — Other Ambulatory Visit: Payer: Self-pay | Admitting: Internal Medicine

## 2020-12-26 DIAGNOSIS — I10 Essential (primary) hypertension: Secondary | ICD-10-CM

## 2021-01-07 ENCOUNTER — Other Ambulatory Visit: Payer: Self-pay | Admitting: Internal Medicine

## 2021-01-07 DIAGNOSIS — I10 Essential (primary) hypertension: Secondary | ICD-10-CM

## 2021-02-13 ENCOUNTER — Other Ambulatory Visit: Payer: Self-pay | Admitting: Internal Medicine

## 2021-02-13 DIAGNOSIS — I10 Essential (primary) hypertension: Secondary | ICD-10-CM

## 2021-02-22 ENCOUNTER — Ambulatory Visit (INDEPENDENT_AMBULATORY_CARE_PROVIDER_SITE_OTHER): Payer: 59 | Admitting: Internal Medicine

## 2021-02-22 VITALS — BP 128/82 | HR 75 | Temp 97.8°F | Ht 73.0 in | Wt 312.7 lb

## 2021-02-22 DIAGNOSIS — R7302 Impaired glucose tolerance (oral): Secondary | ICD-10-CM

## 2021-02-22 DIAGNOSIS — I10 Essential (primary) hypertension: Secondary | ICD-10-CM

## 2021-02-22 DIAGNOSIS — E559 Vitamin D deficiency, unspecified: Secondary | ICD-10-CM

## 2021-02-22 NOTE — Progress Notes (Signed)
Established Patient Office Visit     This visit occurred during the SARS-CoV-2 public health emergency.  Safety protocols were in place, including screening questions prior to the visit, additional usage of staff PPE, and extensive cleaning of exam room while observing appropriate contact time as indicated for disinfecting solutions.    CC/Reason for Visit: 64-month follow-up chronic medical conditions  HPI: Mike [name] is a 50 y.o. male who is coming in today for the above mentioned reasons. Past Medical History is significant for: Hypertension, gout morbid obesity, impaired glucose tolerance and vitamin D deficiency.  He has lost 38 pounds since I last saw him in June through healthy lifestyle changes.  He had his colonoscopy in August.  He has had his COVID booster.  He states he feels much less fatigued.   Past Medical/Surgical History: Past Medical History:  Diagnosis Date   Gout 03/25/2019   Uric acid 9.0 January 2021.  Starting allopurinol.  Prescribed colchicine for gout prophylaxis for the first 1-3 months.   Hypertension    Morbid obesity ([place])    MRSA infection    11 years ago    No past surgical history on file.  Social History:  reports that he has quit smoking. His smoking use included cigarettes. He has never used smokeless tobacco. He reports current alcohol use of about 21.0 standard drinks per week. He reports that he does not currently use drugs.  Allergies: Allergies  Allergen Reactions   Lisinopril     Swelling around eyes    Family History:  Family History  Problem Relation Age of Onset   Hypertension Mother    Diabetes Mother    Hypertension Father    Diabetes Father    Diabetes Brother    Colon cancer Neg Hx    Esophageal cancer Neg Hx    Rectal cancer Neg Hx    Stomach cancer Neg Hx      Current Outpatient Medications:    allopurinol (ZYLOPRIM) 300 MG tablet, TAKE 1 TABLET (300 MG TOTAL) BY MOUTH DAILY. TO LOWER URIC ACID TO  PREVENT GOUT LONG-TERM., Disp: 90 tablet, Rfl: 3   amLODipine (NORVASC) 5 MG tablet, TAKE 1 TABLET (5 MG TOTAL) BY MOUTH DAILY., Disp: 90 tablet, Rfl: 1   hydrochlorothiazide (HYDRODIURIL) 25 MG tablet, TAKE 1 TABLET (25 MG TOTAL) BY MOUTH DAILY., Disp: 90 tablet, Rfl: 1   metoprolol tartrate (LOPRESSOR) 50 MG tablet, TAKE 1 TABLET BY MOUTH TWICE A DAY, Disp: 180 tablet, Rfl: 1   TURMERIC PO, Take 1 tablet by mouth daily., Disp: , Rfl:   Current Facility-Administered Medications:    0.9 %  sodium chloride infusion, 500 mL, Intravenous, Once, Mansouraty, Telford Nab., MD  Review of Systems:  Constitutional: Denies fever, chills, diaphoresis, appetite change and fatigue.  HEENT: Denies photophobia, eye pain, redness, hearing loss, ear pain, congestion, sore throat, rhinorrhea, sneezing, mouth sores, trouble swallowing, neck pain, neck stiffness and tinnitus.   Respiratory: Denies SOB, DOE, cough, chest tightness,  and wheezing.   Cardiovascular: Denies chest pain, palpitations and leg swelling.  Gastrointestinal: Denies nausea, vomiting, abdominal pain, diarrhea, constipation, blood in stool and abdominal distention.  Genitourinary: Denies dysuria, urgency, frequency, hematuria, flank pain and difficulty urinating.  Endocrine: Denies: hot or cold intolerance, sweats, changes in hair or nails, polyuria, polydipsia. Musculoskeletal: Denies myalgias, back pain, joint swelling, arthralgias and gait problem.  Skin: Denies pallor, rash and wound.  Neurological: Denies dizziness, seizures, syncope, weakness, light-headedness, numbness and headaches.  Hematological: Denies adenopathy. Easy bruising, personal or family bleeding history  Psychiatric/Behavioral: Denies suicidal ideation, mood changes, confusion, nervousness, sleep disturbance and agitation    Physical Exam: Vitals:   02/22/21 0852  BP: 128/82  Pulse: 75  Temp: 97.8 F (36.6 C)  TempSrc: Oral  SpO2: 100%  Weight: (!) 312 lb 11.2  oz (141.8 kg)  Height: 6\' 1"  (1.854 m)    Body mass index is 41.26 kg/m.   Constitutional: NAD, calm, comfortable Eyes: PERRL, lids and conjunctivae normal ENMT: Mucous membranes are moist.  Respiratory: clear to auscultation bilaterally, no wheezing, no crackles. Normal respiratory effort. No accessory muscle use.  Cardiovascular: Regular rate and rhythm, no murmurs / rubs / gallops. No extremity edema.  Neurologic: Grossly intact and nonfocal Psychiatric: Normal judgment and insight. Alert and oriented x 3. Normal mood.    Impression and Plan:  Vitamin D deficiency  - Plan: VITAMIN D 25 Hydroxy (Vit-D Deficiency, Fractures)  IGT (impaired glucose tolerance) -A1c has improved from 6-5.1.  Primary hypertension -Blood pressures well controlled.  Morbid obesity ([place]) -Discussed healthy lifestyle, including increased physical activity and better food choices to promote weight loss.  Time spent: 30 minutes reviewing chart, interviewing and examining patient and formulating plan of care.    Lelon Frohlich, MD [facility]

## 2021-03-06 IMAGING — CR LUMBAR SPINE - 2-3 VIEW
3 series · 3 of 3 positions shown · non-contrast
Comparison: None.

CLINICAL DATA: Status post fall with pain in the pelvis.

EXAM:
LUMBAR SPINE - 2-3 VIEW

[w lumbar spine ap]
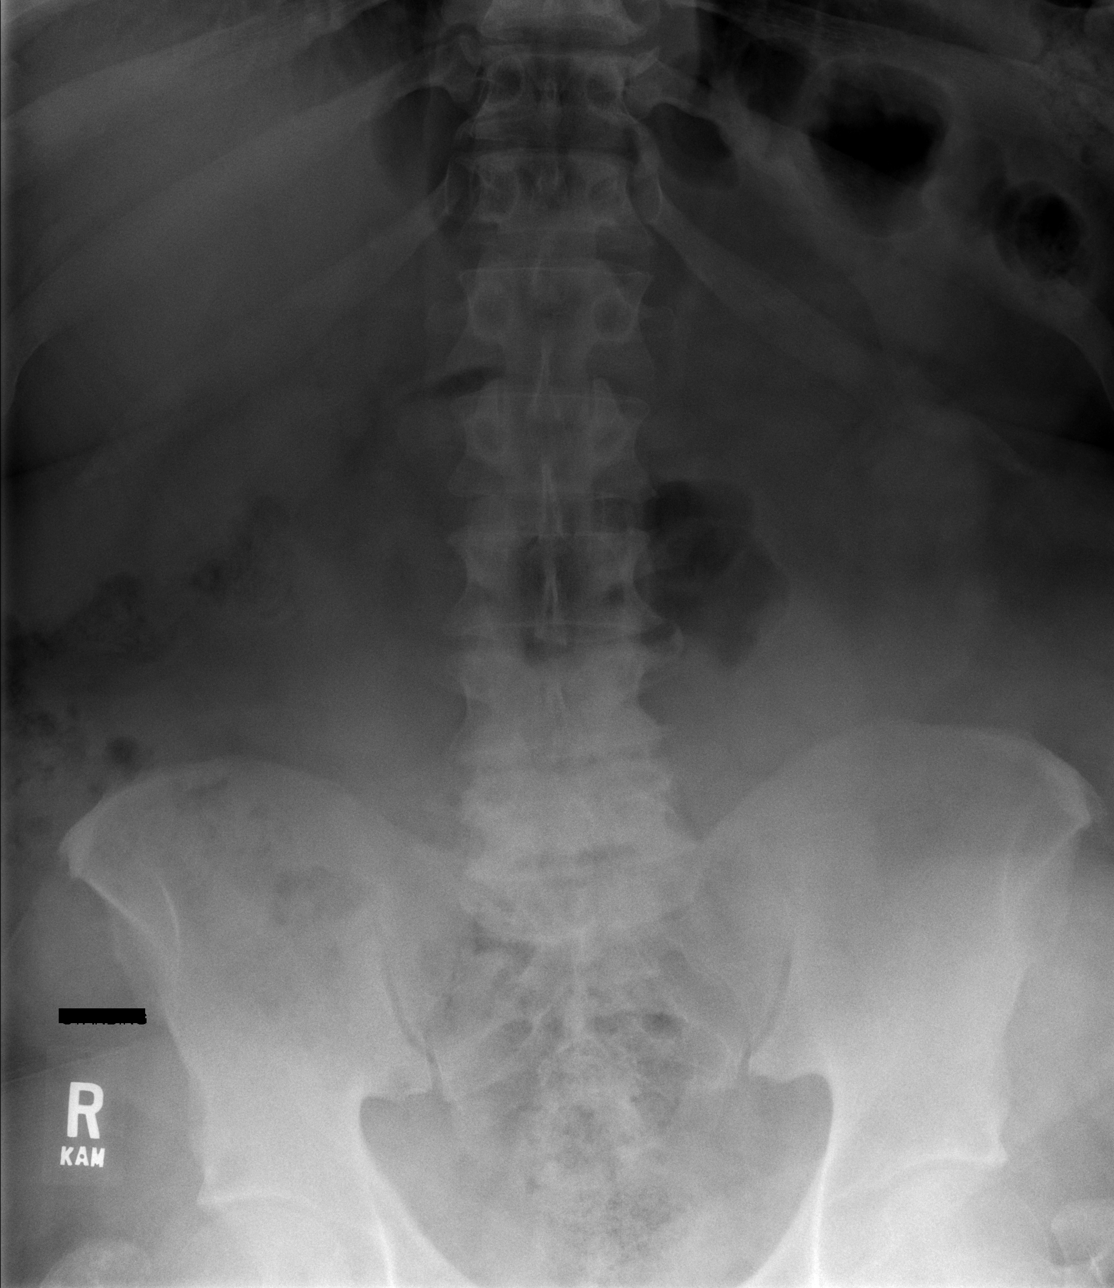

[w lumbar spine lat]
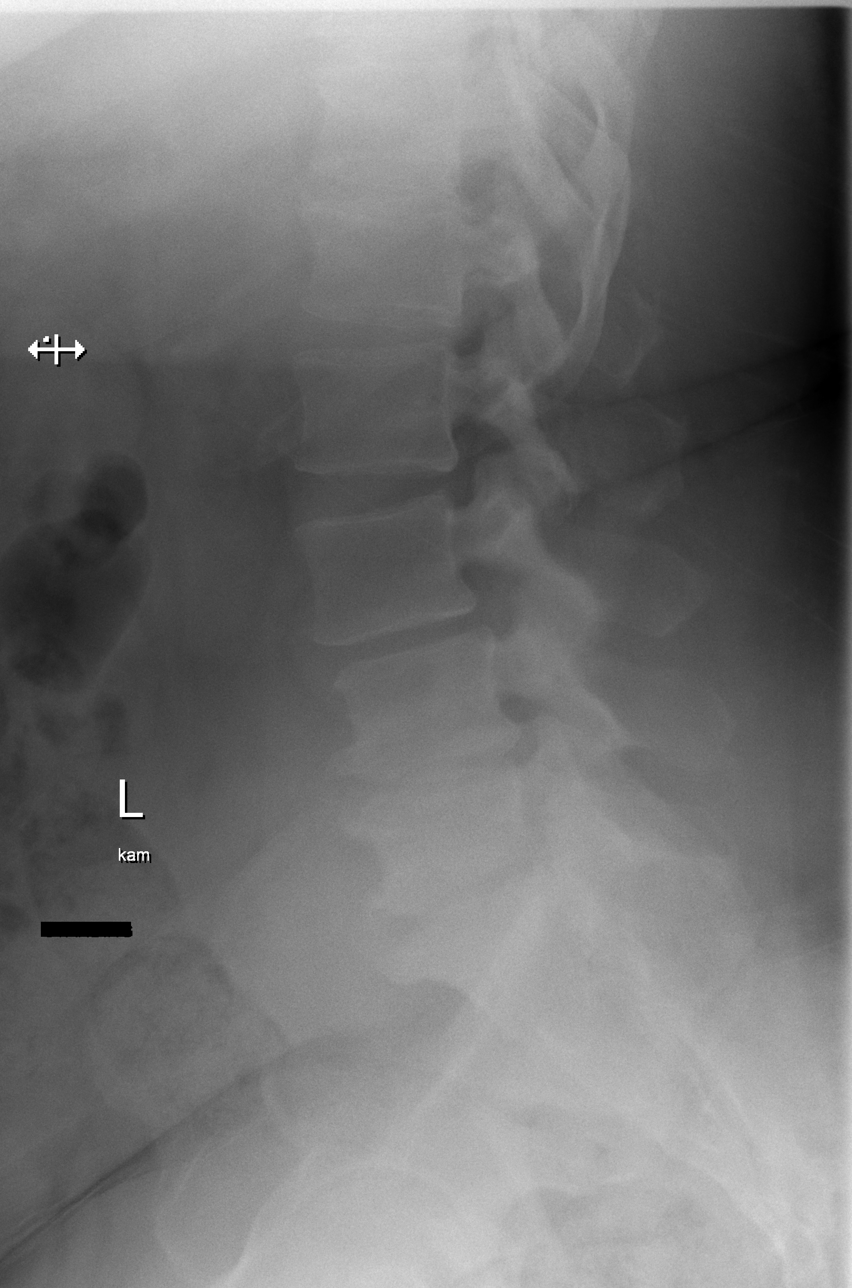

[w lumbar l-5 s-1 spot]
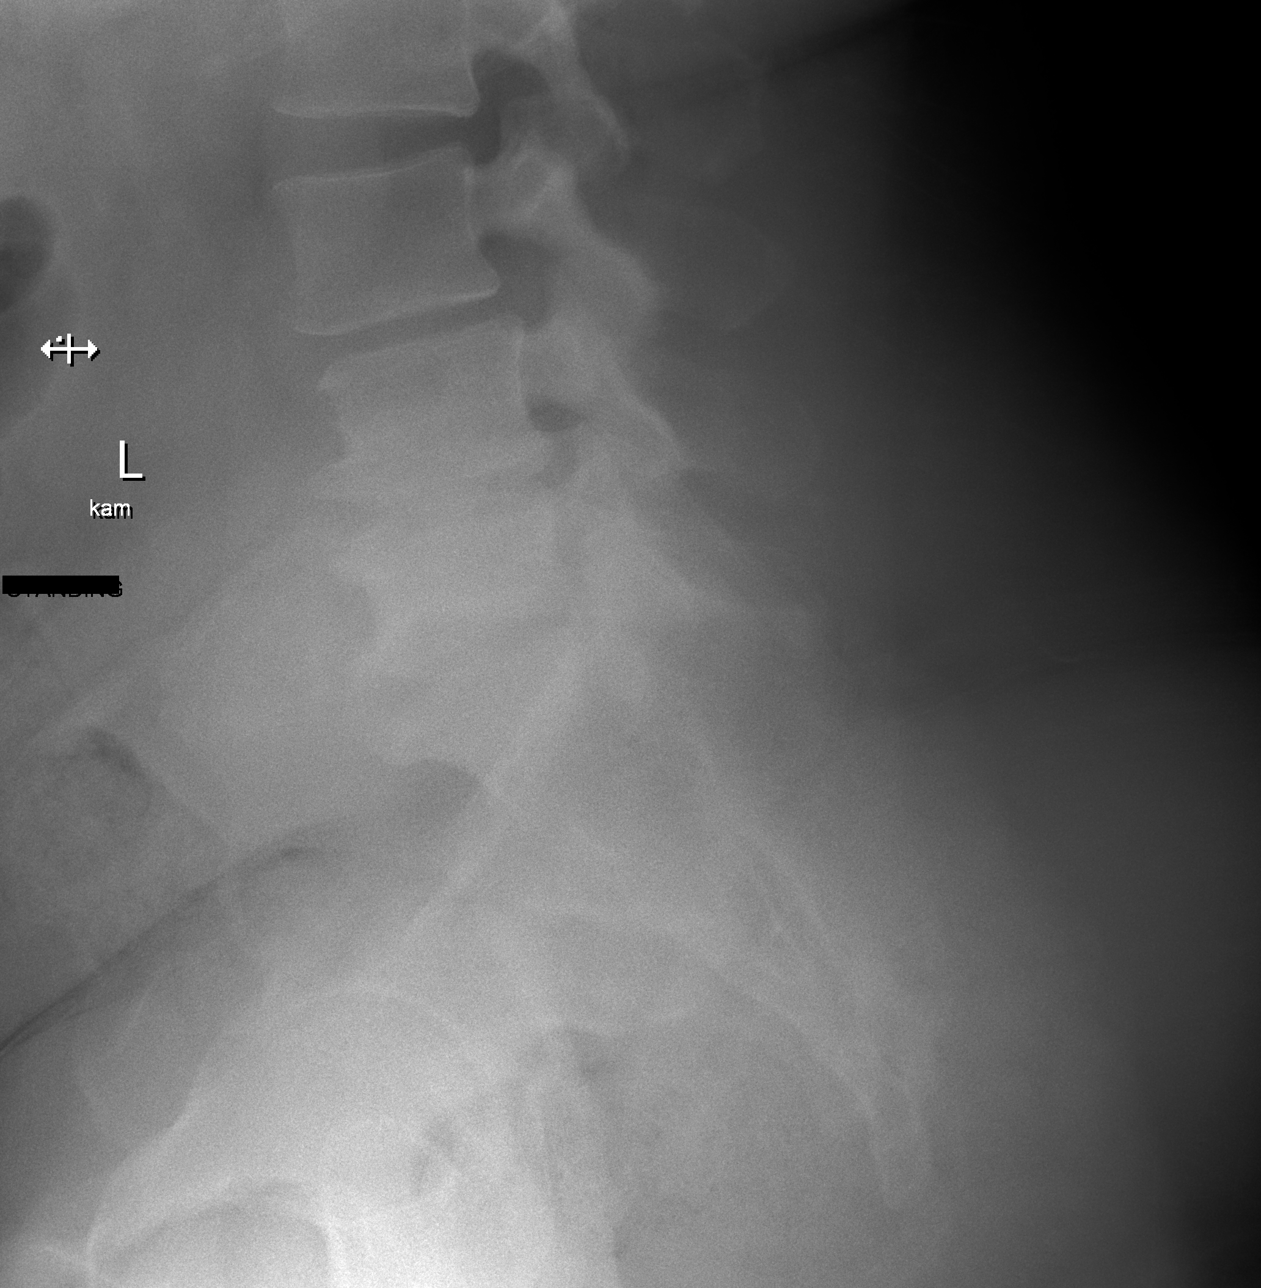

[3 of 3 positions shown; findings below may reference images not displayed]

FINDINGS: There is no evidence of lumbar spine fracture. Alignment is normal.
Degenerative joint changes of the lower lumbar spine with narrowed
joint space and osteophyte formation are noted.
IMPRESSION: No acute fracture or dislocation. Degenerative joint changes of
spine.

## 2021-03-06 IMAGING — CR PELVIS - 1-2 VIEW
3 series · 3 of 3 positions shown · non-contrast
Comparison: None.

CLINICAL DATA: Status post fall with pain in the pelvis.

EXAM:
PELVIS - 1-2 VIEW

[t pelvis ap (1 of 3)]
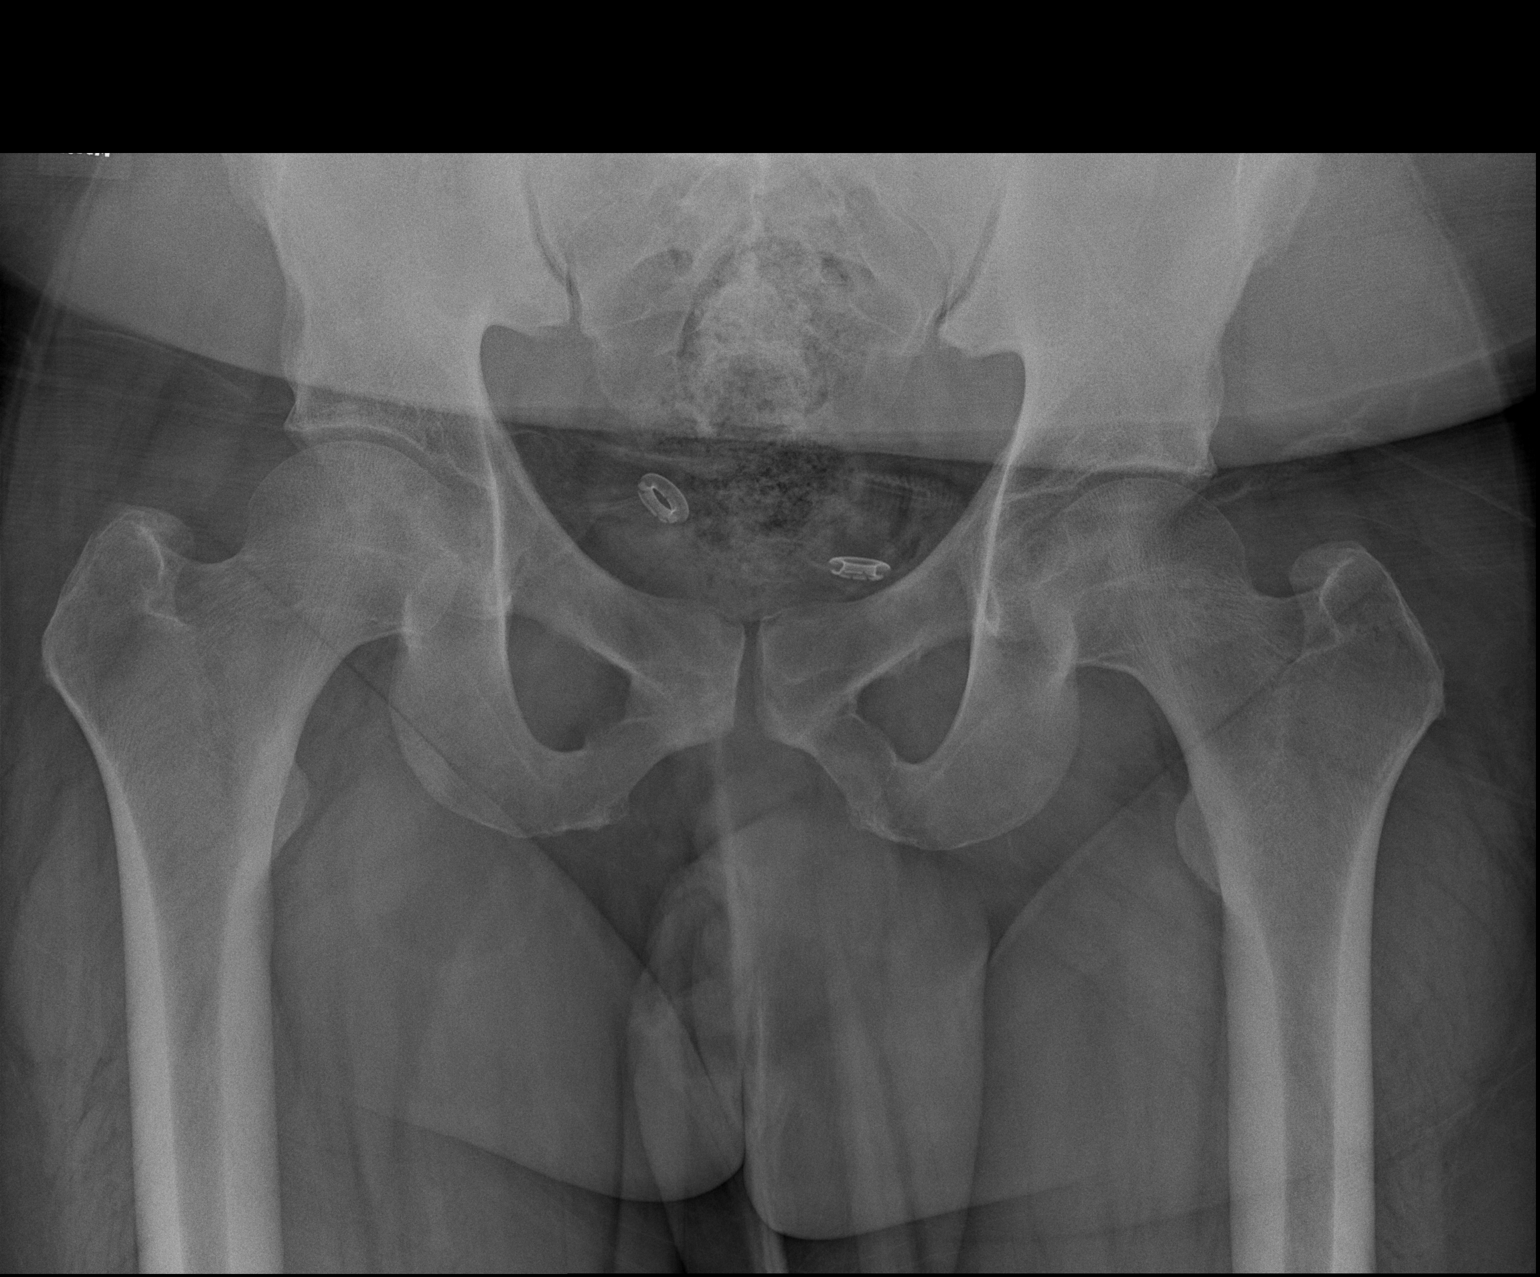

[t pelvis ap (2 of 3)]
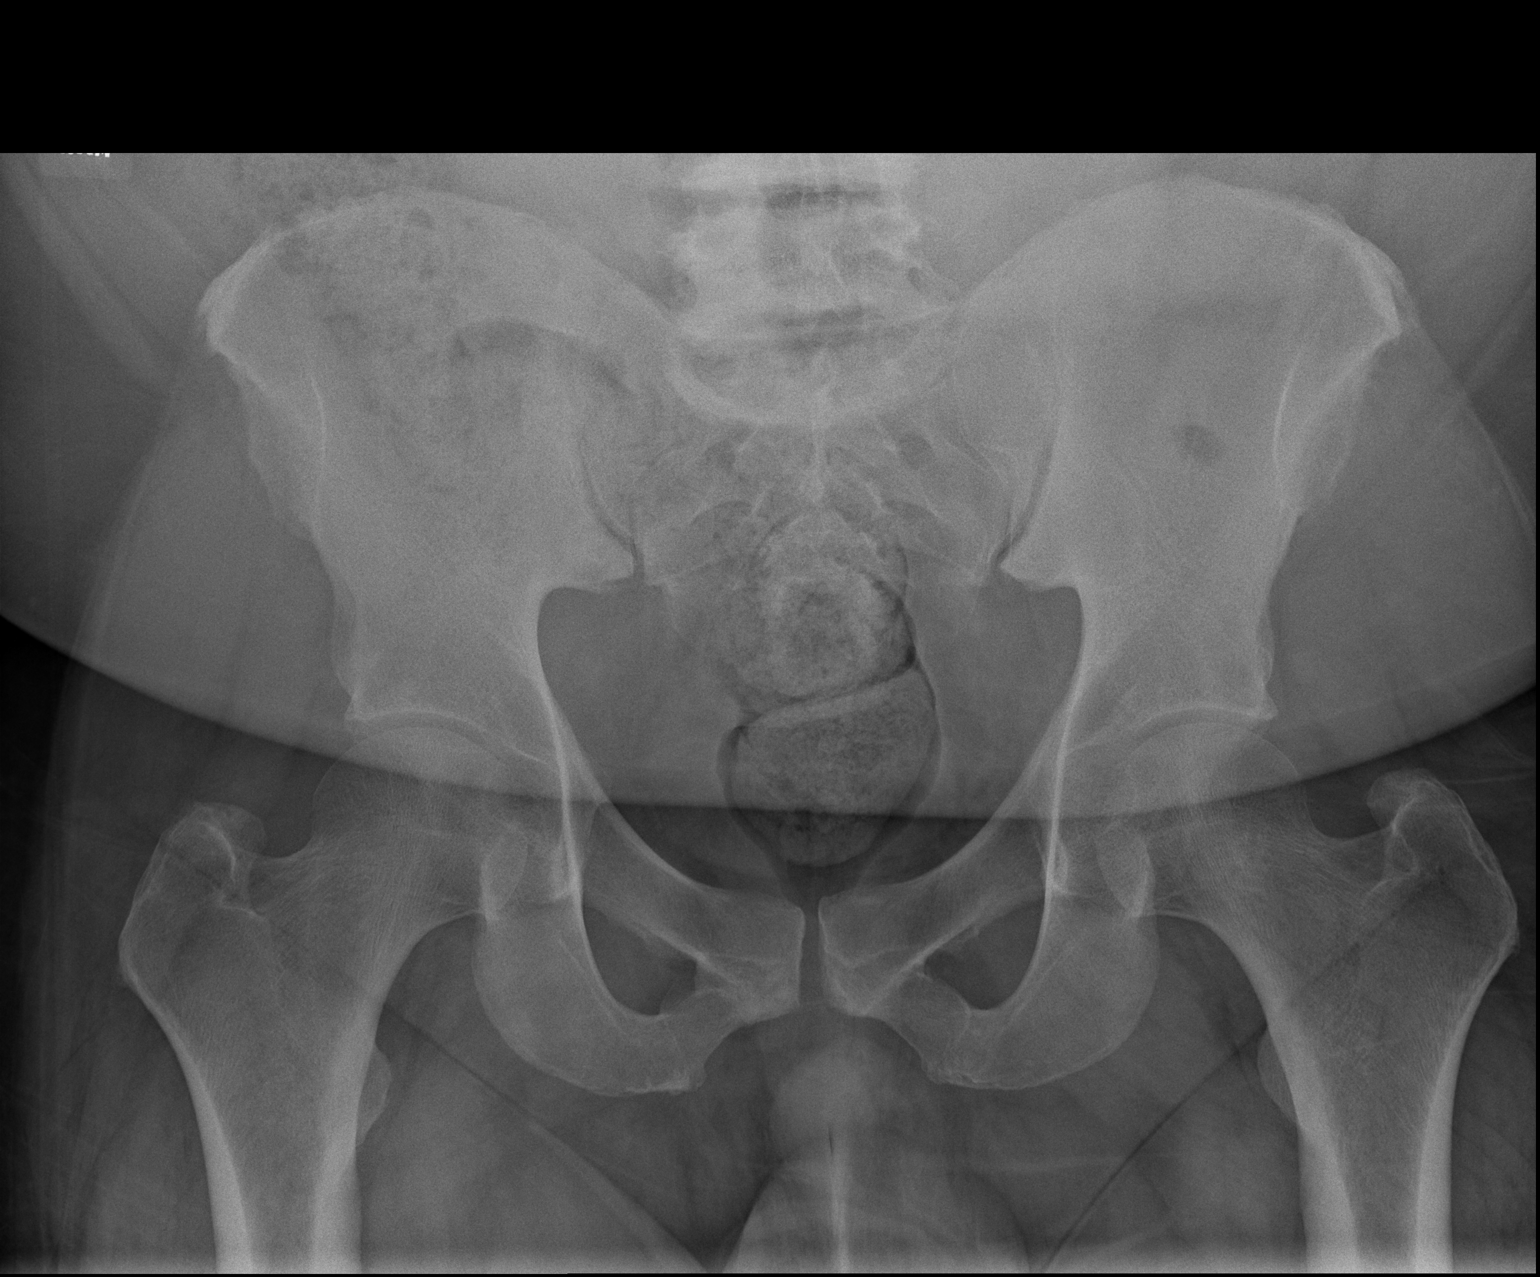

[t pelvis ap (3 of 3)]
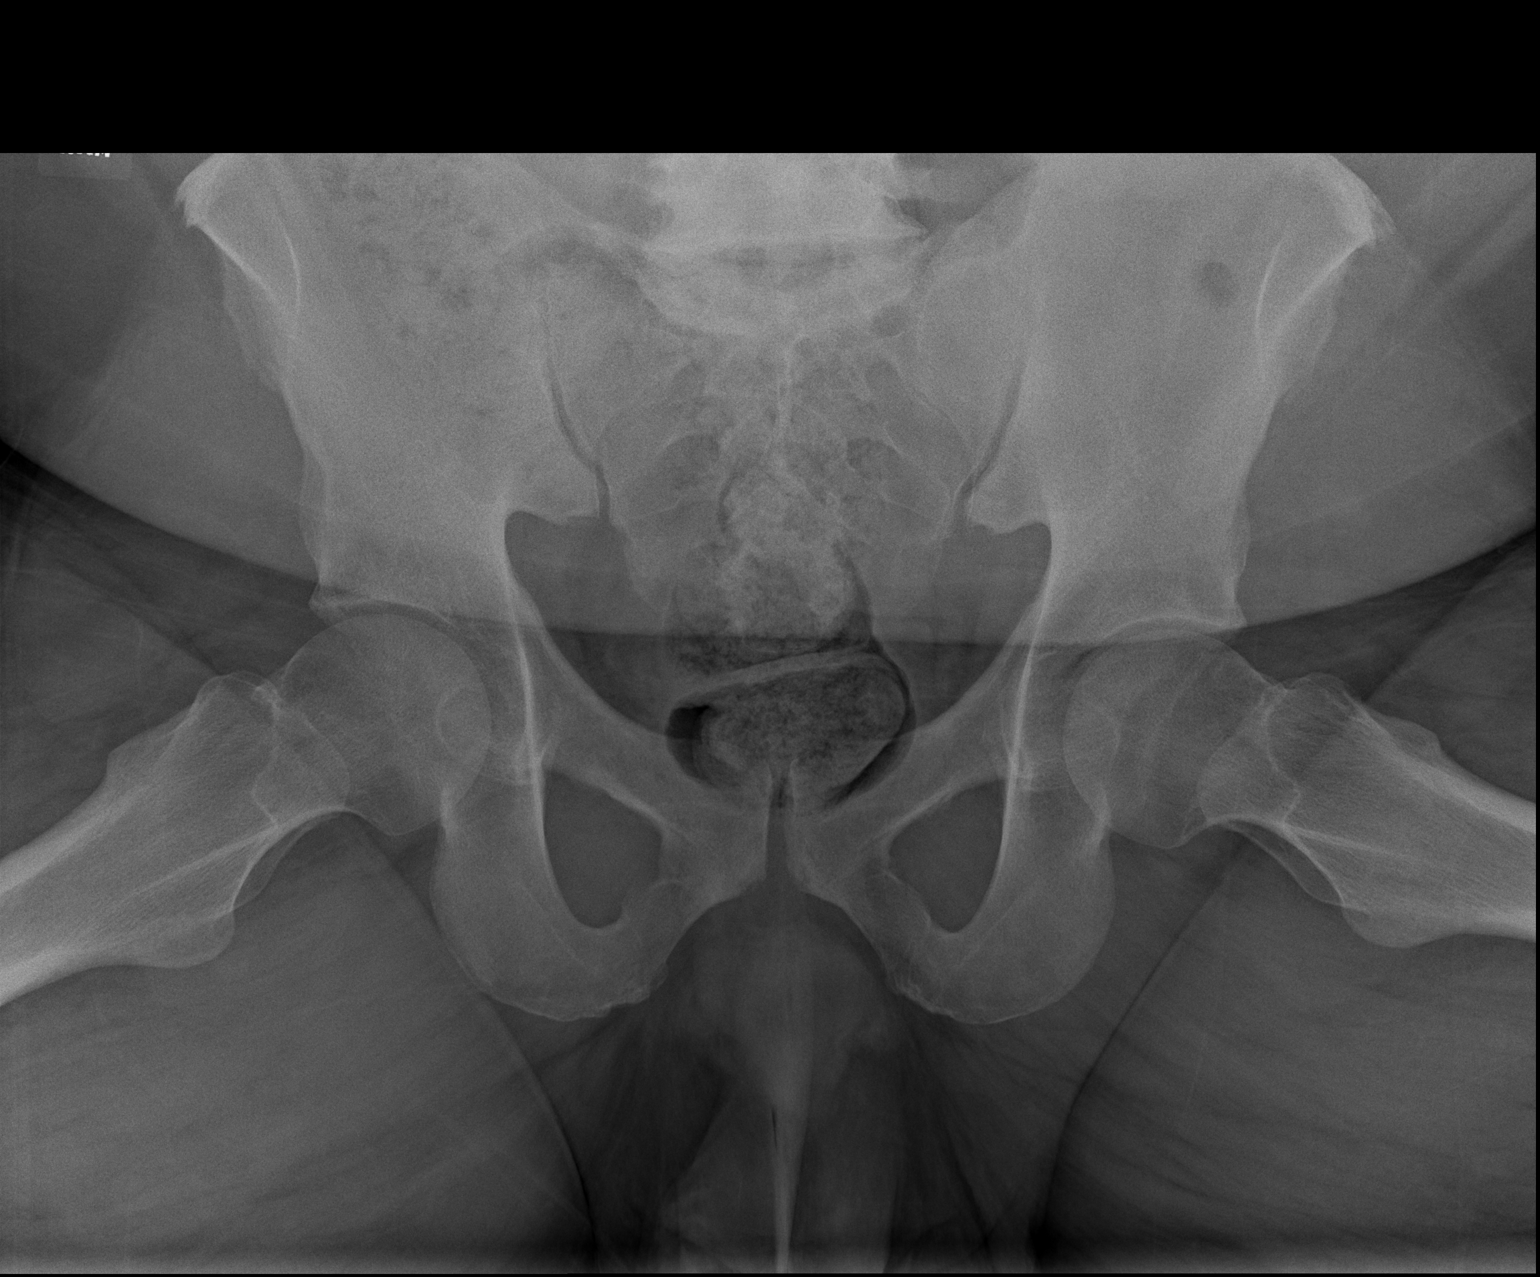

[3 of 3 positions shown; findings below may reference images not displayed]

FINDINGS: There is no evidence of pelvic fracture or dislocation. Mild
narrowing of bilateral hip joint spaces are noted.
IMPRESSION: No acute fracture or dislocation.

## 2021-04-07 ENCOUNTER — Other Ambulatory Visit: Payer: Self-pay | Admitting: Family Medicine

## 2021-04-08 NOTE — Telephone Encounter (Signed)
Rx refill request approved per Dr. Corey's orders. 

## 2021-05-02 ENCOUNTER — Ambulatory Visit (INDEPENDENT_AMBULATORY_CARE_PROVIDER_SITE_OTHER): Payer: 59 | Admitting: Adult Health

## 2021-05-02 ENCOUNTER — Encounter: Payer: Self-pay | Admitting: Adult Health

## 2021-05-02 VITALS — BP 130/90 | HR 87 | Temp 98.8°F | Ht 73.0 in | Wt 308.0 lb

## 2021-05-02 DIAGNOSIS — M545 Low back pain, unspecified: Secondary | ICD-10-CM | POA: Diagnosis not present

## 2021-05-02 MED ORDER — METHYLPREDNISOLONE 4 MG PO TBPK: ORAL_TABLET | ORAL | 0 refills | Status: DC

## 2021-05-02 MED ORDER — CYCLOBENZAPRINE HCL 10 MG PO TABS: 10.0000 mg | ORAL_TABLET | Freq: Three times a day (TID) | ORAL | 0 refills | Status: DC | PRN

## 2021-05-02 NOTE — Progress Notes (Signed)
? ?Subjective:  ? ? Patient ID: Mike [name], male    DOB: 17-Aug-1970, 51 y.o.   MRN: [id number] ? ?HPI ?51 year old male who is being evaluated today for an acute issue.  His symptoms started yesterday a day while at work.  He does do a lot of physical activity while at work.  Reports was doing nothing abnormal developed left-sided low back pain without any radiating pain.  When he got home from work yesterday he tried using Motrin and a lidocaine patch without relief.  When he woke up this morning the back pain was worse.  Pain is relieved with sitting but once he changes positions or bends at the waist the pain is worse again.  Feels as though it is an aching pain.  Denies issues with bowel or bladder ? ? ?Review of Systems ?See HPI  ? ?Past Medical History:  ?Diagnosis Date  ? Gout 03/25/2019  ? Uric acid 9.0 January 2021.  Starting allopurinol.  Prescribed colchicine for gout prophylaxis for the first 1-3 months.  ? Hypertension   ? Morbid obesity ([place])   ? MRSA infection   ? 11 years ago  ? ? ?Social History  ? ?Socioeconomic History  ? Marital status: Married  ?  Spouse name: Not on file  ? Number of children: Not on file  ? Years of education: Not on file  ? Highest education level: 12th grade  ?Occupational History  ? Not on file  ?Tobacco Use  ? Smoking status: Former  ?  Types: Cigarettes  ? Smokeless tobacco: Never  ? Tobacco comments:  ?  Greater than 10 years ago  ?Vaping Use  ? Vaping Use: Never used  ?Substance and Sexual Activity  ? Alcohol use: Yes  ?  Alcohol/week: 21.0 standard drinks  ?  Types: 21 Cans of beer per week  ? Drug use: Not Currently  ?  Comment: greater than 10 years marijuana use  ? Sexual activity: Yes  ?Other Topics Concern  ? Not on file  ?Social History Narrative  ? Not on file  ? ?Social Determinants of Health  ? ?Financial Resource Strain: Low Risk   ? Difficulty of Paying Living Expenses: Not hard at all  ?Food Insecurity: No Food Insecurity  ? Worried About Ship broker in the Last Year: Never true  ? Ran Out of Food in the Last Year: Never true  ?Transportation Needs: No Transportation Needs  ? Lack of Transportation (Medical): No  ? Lack of Transportation (Non-Medical): No  ?Physical Activity: Insufficiently Active  ? Days of Exercise per Week: 3 days  ? Minutes of Exercise per Session: 30 min  ?Stress: No Stress Concern Present  ? Feeling of Stress : Not at all  ?Social Connections: Moderately Isolated  ? Frequency of Communication with Friends and Family: Three times a week  ? Frequency of Social Gatherings with Friends and Family: Once a week  ? Attends Religious Services: Never  ? Active Member of Clubs or Organizations: No  ? Attends Archivist Meetings: Not on file  ? Marital Status: Married  ?Intimate Partner Violence: Not on file  ? ? ?No past surgical history on file. ? ?Family History  ?Problem Relation Age of Onset  ? Hypertension Mother   ? Diabetes Mother   ? Hypertension Father   ? Diabetes Father   ? Diabetes Brother   ? Colon cancer Neg Hx   ? Esophageal cancer Neg Hx   ?  Rectal cancer Neg Hx   ? Stomach cancer Neg Hx   ? ? ?Allergies  ?Allergen Reactions  ? Lisinopril   ?  Swelling around eyes  ? ? ?Current Outpatient Medications on File Prior to Visit  ?Medication Sig Dispense Refill  ? allopurinol (ZYLOPRIM) 300 MG tablet TAKE 1 TABLET (300 MG TOTAL) BY MOUTH DAILY. TO LOWER URIC ACID TO PREVENT GOUT LONG-TERM. 90 tablet 3  ? amLODipine (NORVASC) 5 MG tablet TAKE 1 TABLET (5 MG TOTAL) BY MOUTH DAILY. 90 tablet 1  ? hydrochlorothiazide (HYDRODIURIL) 25 MG tablet TAKE 1 TABLET (25 MG TOTAL) BY MOUTH DAILY. 90 tablet 1  ? metoprolol tartrate (LOPRESSOR) 50 MG tablet TAKE 1 TABLET BY MOUTH TWICE A DAY 180 tablet 1  ? ?Current Facility-Administered Medications on File Prior to Visit  ?Medication Dose Route Frequency Provider Last Rate Last Admin  ? 0.9 %  sodium chloride infusion  500 mL Intravenous Once Mansouraty, Telford Nab., MD      ? ? ?BP  130/90   Pulse 87   Temp 98.8 ?F (37.1 ?C) (Oral)   Ht '6\' 1"'$  (1.854 m)   Wt (!) 308 lb (139.7 kg)   SpO2 97%   BMI 40.64 kg/m?  ? ? ?   ?Objective:  ? Physical Exam ?Vitals and nursing note reviewed.  ?Constitutional:   ?   Appearance: Normal appearance.  ?Musculoskeletal:     ?   General: Tenderness present. Normal range of motion.  ?     Back: ? ?   Comments: Tenderness with palpation to lower lumbar paraspinal muscle on the left side.  ?Skin: ?   General: Skin is warm and dry.  ?Neurological:  ?   General: No focal deficit present.  ?   Mental Status: He is alert and oriented to person, place, and time.  ?Psychiatric:     ?   Mood and Affect: Mood normal.     ?   Behavior: Behavior normal.     ?   Thought Content: Thought content normal.  ? ?   ?Assessment & Plan:  ?1. Acute left-sided low back pain without sciatica ?- Appears as muscle strain  ?-Advised stretching, warm compress.  Will prescribe short course of Flexeril and Medrol Dosepak to help with symptom relief.  Follow-up as needed ?- cyclobenzaprine (FLEXERIL) 10 MG tablet; Take 1 tablet (10 mg total) by mouth 3 (three) times daily as needed for muscle spasms.  Dispense: 15 tablet; Refill: 0 ?- methylPREDNISolone (MEDROL DOSEPAK) 4 MG TBPK tablet; Take as directed  Dispense: 21 tablet; Refill: 0 ? ?Dorothyann Peng, NP ? ?

## 2021-05-27 ENCOUNTER — Other Ambulatory Visit: Payer: Self-pay | Admitting: Internal Medicine

## 2021-05-27 DIAGNOSIS — I10 Essential (primary) hypertension: Secondary | ICD-10-CM

## 2021-06-26 ENCOUNTER — Other Ambulatory Visit: Payer: Self-pay | Admitting: Internal Medicine

## 2021-06-26 DIAGNOSIS — I10 Essential (primary) hypertension: Secondary | ICD-10-CM

## 2021-08-20 ENCOUNTER — Ambulatory Visit (INDEPENDENT_AMBULATORY_CARE_PROVIDER_SITE_OTHER): Payer: 59 | Admitting: Internal Medicine

## 2021-08-20 ENCOUNTER — Ambulatory Visit: Payer: 59 | Admitting: Internal Medicine

## 2021-08-20 VITALS — BP 140/98 | HR 85 | Temp 97.9°F | Wt 315.8 lb

## 2021-08-20 DIAGNOSIS — I10 Essential (primary) hypertension: Secondary | ICD-10-CM | POA: Diagnosis not present

## 2021-08-20 DIAGNOSIS — R7302 Impaired glucose tolerance (oral): Secondary | ICD-10-CM | POA: Diagnosis not present

## 2021-08-20 LAB — POCT GLYCOSYLATED HEMOGLOBIN (HGB A1C): Hemoglobin A1C: 5 % (ref 4.0–5.6)

## 2021-09-03 ENCOUNTER — Ambulatory Visit: Payer: 59 | Admitting: Internal Medicine

## 2021-09-21 ENCOUNTER — Other Ambulatory Visit: Payer: Self-pay | Admitting: Internal Medicine

## 2021-09-21 DIAGNOSIS — I10 Essential (primary) hypertension: Secondary | ICD-10-CM

## 2021-10-01 ENCOUNTER — Encounter: Payer: Self-pay | Admitting: Internal Medicine

## 2021-10-01 ENCOUNTER — Ambulatory Visit (INDEPENDENT_AMBULATORY_CARE_PROVIDER_SITE_OTHER): Payer: 59 | Admitting: Internal Medicine

## 2021-10-01 VITALS — BP 135/92 | HR 70 | Temp 98.1°F | Wt 317.0 lb

## 2021-10-01 DIAGNOSIS — Z23 Encounter for immunization: Secondary | ICD-10-CM

## 2021-10-01 DIAGNOSIS — I10 Essential (primary) hypertension: Secondary | ICD-10-CM

## 2021-10-01 MED ORDER — VALSARTAN-HYDROCHLOROTHIAZIDE 160-25 MG PO TABS: 1.0000 | ORAL_TABLET | Freq: Every day | ORAL | 1 refills | Status: DC

## 2021-10-01 NOTE — Addendum Note (Signed)
Addended by: Westley Hummer B on: 10/01/2021 09:08 AM   Modules accepted: Orders

## 2021-10-01 NOTE — Patient Instructions (Signed)
-  Nice seeing you today!!  -Second shingles vaccine today.  -STOP HCTZ.  -Start valsartan HCT 1 tablet daily.  -Schedule follow up in 6-8 weeks for BP.

## 2021-10-01 NOTE — Progress Notes (Signed)
Established Patient Office Visit     CC/Reason for Visit: Blood pressure follow-up  HPI: Mike [name] is a 51 y.o. male who is coming in today for the above mentioned reasons. Past Medical History is significant for: Hypertension, impaired glucose tolerance, morbid obesity, gout, vitamin D deficiency.  He is here today to follow-up on blood pressure after he was noted to be elevated at last visit.  He is currently on hydrochlorothiazide 25 mg daily, amlodipine 5 mg daily and metoprolol 50 mg twice daily.  He is feeling well and has no acute concerns or complaints.  I will insert his ambulatory blood pressure log below.      Past Medical/Surgical History: Past Medical History:  Diagnosis Date   Gout 03/25/2019   Uric acid 9.0 January 2021.  Starting allopurinol.  Prescribed colchicine for gout prophylaxis for the first 1-3 months.   Hypertension    Morbid obesity ([place])    MRSA infection    11 years ago    No past surgical history on file.  Social History:  reports that he has quit smoking. His smoking use included cigarettes. He has never used smokeless tobacco. He reports current alcohol use of about 21.0 standard drinks of alcohol per week. He reports that he does not currently use drugs.  Allergies: Allergies  Allergen Reactions   Lisinopril     Swelling around eyes    Family History:  Family History  Problem Relation Age of Onset   Hypertension Mother    Diabetes Mother    Hypertension Father    Diabetes Father    Diabetes Brother    Colon cancer Neg Hx    Esophageal cancer Neg Hx    Rectal cancer Neg Hx    Stomach cancer Neg Hx      Current Outpatient Medications:    allopurinol (ZYLOPRIM) 300 MG tablet, TAKE 1 TABLET (300 MG TOTAL) BY MOUTH DAILY. TO LOWER URIC ACID TO PREVENT GOUT LONG-TERM., Disp: 90 tablet, Rfl: 3   amLODipine (NORVASC) 5 MG tablet, TAKE 1 TABLET (5 MG TOTAL) BY MOUTH DAILY., Disp: 90 tablet, Rfl: 1   metoprolol tartrate  (LOPRESSOR) 50 MG tablet, TAKE 1 TABLET BY MOUTH TWICE A DAY, Disp: 180 tablet, Rfl: 1   valsartan-hydrochlorothiazide (DIOVAN-HCT) 160-25 MG tablet, Take 1 tablet by mouth daily., Disp: 90 tablet, Rfl: 1  Current Facility-Administered Medications:    0.9 %  sodium chloride infusion, 500 mL, Intravenous, Once, Mansouraty, Telford Nab., MD  Review of Systems:  Constitutional: Denies fever, chills, diaphoresis, appetite change and fatigue.  HEENT: Denies photophobia, eye pain, redness, hearing loss, ear pain, congestion, sore throat, rhinorrhea, sneezing, mouth sores, trouble swallowing, neck pain, neck stiffness and tinnitus.   Respiratory: Denies SOB, DOE, cough, chest tightness,  and wheezing.   Cardiovascular: Denies chest pain, palpitations and leg swelling.  Gastrointestinal: Denies nausea, vomiting, abdominal pain, diarrhea, constipation, blood in stool and abdominal distention.  Genitourinary: Denies dysuria, urgency, frequency, hematuria, flank pain and difficulty urinating.  Endocrine: Denies: hot or cold intolerance, sweats, changes in hair or nails, polyuria, polydipsia. Musculoskeletal: Denies myalgias, back pain, joint swelling, arthralgias and gait problem.  Skin: Denies pallor, rash and wound.  Neurological: Denies dizziness, seizures, syncope, weakness, light-headedness, numbness and headaches.  Hematological: Denies adenopathy. Easy bruising, personal or family bleeding history  Psychiatric/Behavioral: Denies suicidal ideation, mood changes, confusion, nervousness, sleep disturbance and agitation    Physical Exam: Vitals:   10/01/21 0831 10/01/21 0846  BP: Marland Kitchen)  130/90 (!) 135/92  Pulse: 70   Temp: 98.1 F (36.7 C)   TempSrc: Oral   SpO2: 98%   Weight: (!) 317 lb (143.8 kg)     Body mass index is 41.82 kg/m.   Constitutional: NAD, calm, comfortable Eyes: PERRL, lids and conjunctivae normal ENMT: Mucous membranes are moist.  Respiratory: clear to auscultation  bilaterally, no wheezing, no crackles. Normal respiratory effort. No accessory muscle use.  Cardiovascular: Regular rate and rhythm, no murmurs / rubs / gallops. No extremity edema.  Psychiatric: Normal judgment and insight. Alert and oriented x 3. Normal mood.    Impression and Plan:  Primary hypertension  - Plan: valsartan-hydrochlorothiazide (DIOVAN-HCT) 160-25 MG tablet -Uncontrolled. -Continue amlodipine 5 mg daily and metoprolol 50 mg twice daily. -Discontinue HCTZ and start Diovan HCT. -Return in 6 to 8 weeks for follow-up.  Need for shingles vaccine -Second shingles vaccine administered today.    Time spent:31 minutes reviewing chart, interviewing and examining patient and formulating plan of care.   Patient Instructions  -Nice seeing you today!!  -Second shingles vaccine today.  -STOP HCTZ.  -Start valsartan HCT 1 tablet daily.  -Schedule follow up in 6-8 weeks for BP.    Lelon Frohlich, MD [facility] at [facility]

## 2021-10-02 ENCOUNTER — Other Ambulatory Visit: Payer: Self-pay | Admitting: Internal Medicine

## 2021-10-02 DIAGNOSIS — I10 Essential (primary) hypertension: Secondary | ICD-10-CM

## 2021-11-26 ENCOUNTER — Ambulatory Visit (INDEPENDENT_AMBULATORY_CARE_PROVIDER_SITE_OTHER): Payer: 59 | Admitting: Internal Medicine

## 2021-11-26 VITALS — BP 144/95 | HR 76 | Temp 98.1°F | Ht 73.0 in | Wt 311.4 lb

## 2021-11-26 DIAGNOSIS — R7302 Impaired glucose tolerance (oral): Secondary | ICD-10-CM

## 2021-11-26 DIAGNOSIS — I10 Essential (primary) hypertension: Secondary | ICD-10-CM

## 2021-11-26 MED ORDER — VALSARTAN-HYDROCHLOROTHIAZIDE 320-25 MG PO TABS: 1.0000 | ORAL_TABLET | Freq: Every day | ORAL | 3 refills | Status: DC

## 2021-11-26 NOTE — Progress Notes (Signed)
Established Patient Office Visit     CC/Reason for Visit: Blood pressure follow-up  HPI: Mike [name] is a 51 y.o. male who is coming in today for the above mentioned reasons. Past Medical History is significant for: Hypertension, obesity, impaired glucose tolerance.  He is here today for blood pressure follow-up after switching out his hydrochlorothiazide for Diovan HCT at last visit.  He brings in ambulatory measurements that I will insert below:        Past Medical/Surgical History: Past Medical History:  Diagnosis Date   Gout 03/25/2019   Uric acid 9.0 January 2021.  Starting allopurinol.  Prescribed colchicine for gout prophylaxis for the first 1-3 months.   Hypertension    Morbid obesity ([place])    MRSA infection    11 years ago    No past surgical history on file.  Social History:  reports that he has quit smoking. His smoking use included cigarettes. He has never used smokeless tobacco. He reports current alcohol use of about 21.0 standard drinks of alcohol per week. He reports that he does not currently use drugs.  Allergies: Allergies  Allergen Reactions   Lisinopril     Swelling around eyes    Family History:  Family History  Problem Relation Age of Onset   Hypertension Mother    Diabetes Mother    Hypertension Father    Diabetes Father    Diabetes Brother    Colon cancer Neg Hx    Esophageal cancer Neg Hx    Rectal cancer Neg Hx    Stomach cancer Neg Hx      Current Outpatient Medications:    allopurinol (ZYLOPRIM) 300 MG tablet, TAKE 1 TABLET (300 MG TOTAL) BY MOUTH DAILY. TO LOWER URIC ACID TO PREVENT GOUT LONG-TERM., Disp: 90 tablet, Rfl: 3   amLODipine (NORVASC) 5 MG tablet, TAKE 1 TABLET (5 MG TOTAL) BY MOUTH DAILY., Disp: 90 tablet, Rfl: 1   Ginkgo 60 MG TABS, Take by mouth., Disp: , Rfl:    metoprolol tartrate (LOPRESSOR) 50 MG tablet, TAKE 1 TABLET BY MOUTH TWICE A DAY, Disp: 180 tablet, Rfl: 1   OVER THE COUNTER MEDICATION,  [place], Disp: , Rfl:    OVER THE COUNTER MEDICATION, Tongkat ali, Disp: , Rfl:    OVER THE COUNTER MEDICATION, shilajit, Disp: , Rfl:    Turmeric 500 MG CAPS, Take by mouth., Disp: , Rfl:    valsartan-hydrochlorothiazide (DIOVAN-HCT) 320-25 MG tablet, Take 1 tablet by mouth daily., Disp: 90 tablet, Rfl: 3  Current Facility-Administered Medications:    0.9 %  sodium chloride infusion, 500 mL, Intravenous, Once, Mansouraty, Telford Nab., MD  Review of Systems:  Constitutional: Denies fever, chills, diaphoresis, appetite change and fatigue.  HEENT: Denies photophobia, eye pain, redness, hearing loss, ear pain, congestion, sore throat, rhinorrhea, sneezing, mouth sores, trouble swallowing, neck pain, neck stiffness and tinnitus.   Respiratory: Denies SOB, DOE, cough, chest tightness,  and wheezing.   Cardiovascular: Denies chest pain, palpitations and leg swelling.  Gastrointestinal: Denies nausea, vomiting, abdominal pain, diarrhea, constipation, blood in stool and abdominal distention.  Genitourinary: Denies dysuria, urgency, frequency, hematuria, flank pain and difficulty urinating.  Endocrine: Denies: hot or cold intolerance, sweats, changes in hair or nails, polyuria, polydipsia. Musculoskeletal: Denies myalgias, back pain, joint swelling, arthralgias and gait problem.  Skin: Denies pallor, rash and wound.  Neurological: Denies dizziness, seizures, syncope, weakness, light-headedness, numbness and headaches.  Hematological: Denies adenopathy. Easy bruising, personal or family bleeding history  Psychiatric/Behavioral: Denies suicidal ideation, mood changes, confusion, nervousness, sleep disturbance and agitation    Physical Exam: Vitals:   11/26/21 0706 11/26/21 0710  BP: (!) 140/100 (!) 144/95  Pulse: 76   Temp: 98.1 F (36.7 C)   TempSrc: Oral   SpO2: 100%   Weight: (!) 311 lb 6.4 oz (141.3 kg)   Height: '6\' 1"'$  (1.854 m)     Body mass index is 41.08 kg/m.   Constitutional:  NAD, calm, comfortable Eyes: PERRL, lids and conjunctivae normal ENMT: Mucous membranes are moist.  Respiratory: clear to auscultation bilaterally, no wheezing, no crackles. Normal respiratory effort. No accessory muscle use.  Cardiovascular: Regular rate and rhythm, no murmurs / rubs / gallops. No extremity edema. Psychiatric: Normal judgment and insight. Alert and oriented x 3. Normal mood.    Impression and Plan:  Primary hypertension - Plan: valsartan-hydrochlorothiazide (DIOVAN-HCT) 320-25 MG tablet  IGT (impaired glucose tolerance)  Morbid obesity ([facility])  -Blood pressure is improved but still not at goal. -Continue amlodipine 5 mg daily, metoprolol 50 mg twice daily.  Increase Diovan HCT to 320/25 mg. -Return in 6 weeks with ambulatory blood pressure measurements and home blood pressure cuff to calibrate. -A1c was 5.0 in June. -Discussed healthy lifestyle, including increased physical activity and better food choices to promote weight loss.    Time spent:31 minutes reviewing chart, interviewing and examining patient and formulating plan of care.     Lelon Frohlich, MD [facility] at [facility]

## 2022-02-06 ENCOUNTER — Ambulatory Visit: Payer: 59 | Admitting: Internal Medicine

## 2022-03-01 ENCOUNTER — Other Ambulatory Visit: Payer: Self-pay | Admitting: Internal Medicine

## 2022-03-01 DIAGNOSIS — I10 Essential (primary) hypertension: Secondary | ICD-10-CM

## 2022-03-06 ENCOUNTER — Encounter: Payer: Self-pay | Admitting: Internal Medicine

## 2022-03-06 ENCOUNTER — Ambulatory Visit (INDEPENDENT_AMBULATORY_CARE_PROVIDER_SITE_OTHER): Payer: 59 | Admitting: Internal Medicine

## 2022-03-06 VITALS — BP 157/100 | HR 82 | Temp 97.6°F | Wt 322.1 lb

## 2022-03-06 DIAGNOSIS — I10 Essential (primary) hypertension: Secondary | ICD-10-CM

## 2022-03-06 DIAGNOSIS — E559 Vitamin D deficiency, unspecified: Secondary | ICD-10-CM | POA: Diagnosis not present

## 2022-03-06 DIAGNOSIS — R7302 Impaired glucose tolerance (oral): Secondary | ICD-10-CM | POA: Diagnosis not present

## 2022-03-06 LAB — CBC WITH DIFFERENTIAL/PLATELET
Basophils Absolute: 0 10*3/uL (ref 0.0–0.1)
Basophils Relative: 0.6 % (ref 0.0–3.0)
Eosinophils Absolute: 0.1 10*3/uL (ref 0.0–0.7)
Eosinophils Relative: 2.1 % (ref 0.0–5.0)
HCT: 44.1 % (ref 39.0–52.0)
Hemoglobin: 14.6 g/dL (ref 13.0–17.0)
Lymphocytes Relative: 33 % (ref 12.0–46.0)
Lymphs Abs: 1.6 10*3/uL (ref 0.7–4.0)
MCHC: 33.1 g/dL (ref 30.0–36.0)
MCV: 92.6 fl (ref 78.0–100.0)
Monocytes Absolute: 0.5 10*3/uL (ref 0.1–1.0)
Monocytes Relative: 10.2 % (ref 3.0–12.0)
Neutro Abs: 2.6 10*3/uL (ref 1.4–7.7)
Neutrophils Relative %: 54.1 % (ref 43.0–77.0)
Platelets: 205 10*3/uL (ref 150.0–400.0)
RBC: 4.76 Mil/uL (ref 4.22–5.81)
RDW: 14 % (ref 11.5–15.5)
WBC: 4.8 10*3/uL (ref 4.0–10.5)

## 2022-03-06 LAB — HEMOGLOBIN A1C: Hgb A1c MFr Bld: 5.4 % (ref 4.6–6.5)

## 2022-03-06 LAB — COMPREHENSIVE METABOLIC PANEL
ALT: 29 U/L (ref 0–53)
AST: 26 U/L (ref 0–37)
Albumin: 4.2 g/dL (ref 3.5–5.2)
Alkaline Phosphatase: 60 U/L (ref 39–117)
BUN: 17 mg/dL (ref 6–23)
CO2: 24 mEq/L (ref 19–32)
Calcium: 9.2 mg/dL (ref 8.4–10.5)
Chloride: 105 mEq/L (ref 96–112)
Creatinine, Ser: 0.9 mg/dL (ref 0.40–1.50)
GFR: 99.38 mL/min (ref >60.00)
Glucose, Bld: 93 mg/dL (ref 70–99)
Potassium: 3.8 mEq/L (ref 3.5–5.1)
Sodium: 139 mEq/L (ref 135–145)
Total Bilirubin: 0.3 mg/dL (ref 0.2–1.2)
Total Protein: 7 g/dL (ref 6.0–8.3)

## 2022-03-06 LAB — LIPID PANEL
Cholesterol: 178 mg/dL (ref 0–200)
HDL: 68.1 mg/dL (ref >39.00)
LDL Cholesterol: 95 mg/dL (ref 0–99)
NonHDL: 109.89
Total CHOL/HDL Ratio: 3
Triglycerides: 75 mg/dL (ref 0.0–149.0)
VLDL: 15 mg/dL (ref 0.0–40.0)

## 2022-03-06 LAB — VITAMIN D 25 HYDROXY (VIT D DEFICIENCY, FRACTURES): VITD: 35.4 ng/mL (ref 30.00–100.00)

## 2022-03-06 NOTE — Progress Notes (Signed)
Established Patient Office Visit     CC/Reason for Visit: Follow-up blood pressure  HPI: Mike [name] is a 52 y.o. male who is coming in today for the above mentioned reasons. Past Medical History is significant for: Hypertension, gout, impaired glucose tolerance, morbid obesity.  We have been trying to get his blood pressure better controlled now for a few months.  He is currently on amlodipine 5 mg, Diovan HCT  320/25 mg and metoprolol 50 mg twice daily.  Is compliant.  He is feeling well.  In office blood pressure is uncontrolled, he states this is similar to his ambulatory measurements that are 130-140/100.   Past Medical/Surgical History: Past Medical History:  Diagnosis Date   Gout 03/25/2019   Uric acid 9.0 January 2021.  Starting allopurinol.  Prescribed colchicine for gout prophylaxis for the first 1-3 months.   Hypertension    Morbid obesity ([place])    MRSA infection    11 years ago    No past surgical history on file.  Social History:  reports that he has quit smoking. His smoking use included cigarettes. He has never used smokeless tobacco. He reports current alcohol use of about 21.0 standard drinks of alcohol per week. He reports that he does not currently use drugs.  Allergies: Allergies  Allergen Reactions   Lisinopril     Swelling around eyes    Family History:  Family History  Problem Relation Age of Onset   Hypertension Mother    Diabetes Mother    Hypertension Father    Diabetes Father    Diabetes Brother    Colon cancer Neg Hx    Esophageal cancer Neg Hx    Rectal cancer Neg Hx    Stomach cancer Neg Hx      Current Outpatient Medications:    allopurinol (ZYLOPRIM) 300 MG tablet, TAKE 1 TABLET (300 MG TOTAL) BY MOUTH DAILY. TO LOWER URIC ACID TO PREVENT GOUT LONG-TERM., Disp: 90 tablet, Rfl: 3   amLODipine (NORVASC) 5 MG tablet, TAKE 1 TABLET (5 MG TOTAL) BY MOUTH DAILY., Disp: 90 tablet, Rfl: 1   Ginkgo 60 MG TABS, Take by mouth.,  Disp: , Rfl:    metoprolol tartrate (LOPRESSOR) 50 MG tablet, TAKE 1 TABLET BY MOUTH TWICE A DAY, Disp: 180 tablet, Rfl: 1   Turmeric 500 MG CAPS, Take by mouth., Disp: , Rfl:    valsartan-hydrochlorothiazide (DIOVAN-HCT) 320-25 MG tablet, Take 1 tablet by mouth daily., Disp: 90 tablet, Rfl: 3  Current Facility-Administered Medications:    0.9 %  sodium chloride infusion, 500 mL, Intravenous, Once, Mansouraty, Telford Nab., MD  Review of Systems:  Constitutional: Denies fever, chills, diaphoresis, appetite change and fatigue.  HEENT: Denies photophobia, eye pain, redness, hearing loss, ear pain, congestion, sore throat, rhinorrhea, sneezing, mouth sores, trouble swallowing, neck pain, neck stiffness and tinnitus.   Respiratory: Denies SOB, DOE, cough, chest tightness,  and wheezing.   Cardiovascular: Denies chest pain, palpitations and leg swelling.  Gastrointestinal: Denies nausea, vomiting, abdominal pain, diarrhea, constipation, blood in stool and abdominal distention.  Genitourinary: Denies dysuria, urgency, frequency, hematuria, flank pain and difficulty urinating.  Endocrine: Denies: hot or cold intolerance, sweats, changes in hair or nails, polyuria, polydipsia. Musculoskeletal: Denies myalgias, back pain, joint swelling, arthralgias and gait problem.  Skin: Denies pallor, rash and wound.  Neurological: Denies dizziness, seizures, syncope, weakness, light-headedness, numbness and headaches.  Hematological: Denies adenopathy. Easy bruising, personal or family bleeding history  Psychiatric/Behavioral: Denies suicidal ideation, mood  changes, confusion, nervousness, sleep disturbance and agitation    Physical Exam: Vitals:   03/06/22 0741 03/06/22 0746  BP: (!) 140/100 (!) 157/100  Pulse: 82   Temp: 97.6 F (36.4 C)   TempSrc: Oral   SpO2: 99%   Weight: (!) 322 lb 1.6 oz (146.1 kg)     Body mass index is 42.5 kg/m.   Constitutional: NAD, calm, comfortable Eyes: PERRL, lids  and conjunctivae normal ENMT: Mucous membranes are moist.  Respiratory: clear to auscultation bilaterally, no wheezing, no crackles. Normal respiratory effort. No accessory muscle use.  Cardiovascular: Regular rate and rhythm, no murmurs / rubs / gallops. No extremity edema.   Psychiatric: Normal judgment and insight. Alert and oriented x 3. Normal mood.    Impression and Plan:  Primary hypertension - Plan: CBC with Differential/Platelet, Comprehensive metabolic panel, Ambulatory referral to [facility] Hy[facility] - [facility]  IGT (impaired glucose tolerance) - Plan: Hemoglobin A1c  Morbid obesity ([place]) - Plan: Lipid panel  Vitamin D deficiency - Plan: VITAMIN D 25 Hydroxy (Vit-D Deficiency, Fractures)  -Blood pressure remains uncontrolled.  He is currently on amlodipine 5 mg, Diovan HCT 320/25 mg and metoprolol 50 mg twice a day.  He is adherent to medical therapy.  Initiate referral to advanced hypertension clinic. -Check labs today.  Time spent:33 minutes reviewing chart, interviewing and examining patient and formulating plan of care.     Lelon Frohlich, MD [facility] at [facility]

## 2022-03-31 ENCOUNTER — Ambulatory Visit (INDEPENDENT_AMBULATORY_CARE_PROVIDER_SITE_OTHER): Payer: 59 | Admitting: Cardiovascular Disease

## 2022-03-31 ENCOUNTER — Encounter (HOSPITAL_BASED_OUTPATIENT_CLINIC_OR_DEPARTMENT_OTHER): Payer: Self-pay | Admitting: Cardiovascular Disease

## 2022-03-31 VITALS — BP 146/91 | HR 102 | Ht 72.0 in | Wt 323.3 lb

## 2022-03-31 DIAGNOSIS — I10 Essential (primary) hypertension: Secondary | ICD-10-CM | POA: Diagnosis not present

## 2022-03-31 DIAGNOSIS — Z006 Encounter for examination for normal comparison and control in clinical research program: Secondary | ICD-10-CM

## 2022-03-31 MED ORDER — CARVEDILOL 25 MG PO TABS: 25.0000 mg | ORAL_TABLET | Freq: Two times a day (BID) | ORAL | 3 refills | Status: DC

## 2022-03-31 MED ORDER — OLMESARTAN-AMLODIPINE-HCTZ 40-10-25 MG PO TABS: 1.0000 | ORAL_TABLET | Freq: Every day | ORAL | 1 refills | Status: DC

## 2022-03-31 NOTE — Patient Instructions (Signed)
Medication Instructions:  STOP METOPROLOL   START CARVEDILOL 25 MG TWICE A DAY   STOP VALSARTAN HCT  STOP AMLODIPINE   START OLMESARTAN-AMLODIPINE-HCT 40-10-25 MG DAILY     Labwork: RENIN/ALDOSTERONE SOON    Testing/Procedures: Your physician has requested that you have a renal artery duplex. During this test, an ultrasound is used to evaluate blood flow to the kidneys. Allow one hour for this exam. Do not eat after midnight the day before and avoid carbonated beverages. Take your medications as you usually do.   Follow-Up: 05/29/2022 8:00 AM WITH PHARM D AT [facility] OFFICE  07/31/2022 8:45 AM WITH DR Sandoval AT [facility] OFFICE    Special Instructions:  MONITOR BLOOD PRESSURE TWICE A DAY WITH MACHINE PROVIDED. MAKE SURE ARE LOGGED INTO YOUR VIIVIFY APP   DASH Eating Plan DASH stands for "Dietary Approaches to Stop Hypertension." The DASH eating plan is a healthy eating plan that has been shown to reduce high blood pressure (hypertension). It may also reduce your risk for type 2 diabetes, heart disease, and stroke. The DASH eating plan may also help with weight loss. What are tips for following this plan?  General guidelines Avoid eating more than 2,300 mg (milligrams) of salt (sodium) a day. If you have hypertension, you may need to reduce your sodium intake to 1,500 mg a day. Limit alcohol intake to no more than 1 drink a day for nonpregnant women and 2 drinks a day for men. One drink equals 12 oz of beer, 5 oz of wine, or 1 oz of hard liquor. Work with your health care provider to maintain a healthy body weight or to lose weight. Ask what an ideal weight is for you. Get at least 30 minutes of exercise that causes your heart to beat faster (aerobic exercise) most days of the week. Activities may include walking, swimming, or biking. Work with your health care provider or diet and nutrition specialist (dietitian) to adjust your eating plan to your individual calorie needs. Reading  food labels  Check food labels for the amount of sodium per serving. Choose foods with less than 5 percent of the Daily Value of sodium. Generally, foods with less than 300 mg of sodium per serving fit into this eating plan. To find whole grains, look for the word "whole" as the first word in the ingredient list. Shopping Buy products labeled as "low-sodium" or "no salt added." Buy fresh foods. Avoid canned foods and premade or frozen meals. Cooking Avoid adding salt when cooking. Use salt-free seasonings or herbs instead of table salt or sea salt. Check with your health care provider or pharmacist before using salt substitutes. Do not fry foods. Cook foods using healthy methods such as baking, boiling, grilling, and broiling instead. Cook with heart-healthy oils, such as olive, canola, soybean, or sunflower oil. Meal planning Eat a balanced diet that includes: 5 or more servings of fruits and vegetables each day. At each meal, try to fill half of your plate with fruits and vegetables. Up to 6-8 servings of whole grains each day. Less than 6 oz of lean meat, poultry, or fish each day. A 3-oz serving of meat is about the same size as a deck of cards. One egg equals 1 oz. 2 servings of low-fat dairy each day. A serving of nuts, seeds, or beans 5 times each week. Heart-healthy fats. Healthy fats called Omega-3 fatty acids are found in foods such as flaxseeds and coldwater fish, like sardines, salmon, and mackerel. Limit how much  you eat of the following: Canned or prepackaged foods. Food that is high in trans fat, such as fried foods. Food that is high in saturated fat, such as fatty meat. Sweets, desserts, sugary drinks, and other foods with added sugar. Full-fat dairy products. Do not salt foods before eating. Try to eat at least 2 vegetarian meals each week. Eat more home-cooked food and less restaurant, buffet, and fast food. When eating at a restaurant, ask that your food be prepared  with less salt or no salt, if possible. What foods are recommended? The items listed may not be a complete list. Talk with your dietitian about what dietary choices are best for you. Grains Whole-grain or whole-wheat bread. Whole-grain or whole-wheat pasta. Brown rice. [place]. Bulgur. Whole-grain and low-sodium cereals. Pita bread. Low-fat, low-sodium crackers. Whole-wheat flour tortillas. Vegetables Fresh or frozen vegetables (raw, steamed, roasted, or grilled). Low-sodium or reduced-sodium tomato and vegetable juice. Low-sodium or reduced-sodium tomato sauce and tomato paste. Low-sodium or reduced-sodium canned vegetables. Fruits All fresh, dried, or frozen fruit. Canned fruit in natural juice (without added sugar). Meat and other protein foods Skinless chicken or [place]. Ground chicken or [place]. Pork with fat trimmed off. Fish and seafood. Egg whites. Dried beans, peas, or lentils. Unsalted nuts, nut butters, and seeds. Unsalted canned beans. Lean cuts of beef with fat trimmed off. Low-sodium, lean deli meat. Dairy Low-fat (1%) or fat-free (skim) milk. Fat-free, low-fat, or reduced-fat cheeses. Nonfat, low-sodium ricotta or cottage cheese. Low-fat or nonfat yogurt. Low-fat, low-sodium cheese. Fats and oils Soft margarine without trans fats. Vegetable oil. Low-fat, reduced-fat, or light mayonnaise and salad dressings (reduced-sodium). Canola, safflower, olive, soybean, and sunflower oils. Avocado. Seasoning and other foods Herbs. Spices. Seasoning mixes without salt. Unsalted popcorn and pretzels. Fat-free sweets. What foods are not recommended? The items listed may not be a complete list. Talk with your dietitian about what dietary choices are best for you. Grains Baked goods made with fat, such as croissants, muffins, or some breads. Dry pasta or rice meal packs. Vegetables Creamed or fried vegetables. Vegetables in a cheese sauce. Regular canned vegetables (not low-sodium or  reduced-sodium). Regular canned tomato sauce and paste (not low-sodium or reduced-sodium). Regular tomato and vegetable juice (not low-sodium or reduced-sodium). Angie Fava. Olives. Fruits Canned fruit in a light or heavy syrup. Fried fruit. Fruit in cream or butter sauce. Meat and other protein foods Fatty cuts of meat. Ribs. Fried meat. Berniece Salines. Sausage. Bologna and other processed lunch meats. Salami. Fatback. Hotdogs. Bratwurst. Salted nuts and seeds. Canned beans with added salt. Canned or smoked fish. Whole eggs or egg yolks. Chicken or [place] with skin. Dairy Whole or 2% milk, cream, and half-and-half. Whole or full-fat cream cheese. Whole-fat or sweetened yogurt. Full-fat cheese. Nondairy creamers. Whipped toppings. Processed cheese and cheese spreads. Fats and oils Butter. Stick margarine. Lard. Shortening. Ghee. Bacon fat. Tropical oils, such as coconut, palm kernel, or palm oil. Seasoning and other foods Salted popcorn and pretzels. Onion salt, garlic salt, seasoned salt, table salt, and sea salt. Worcestershire sauce. Tartar sauce. Barbecue sauce. Teriyaki sauce. Soy sauce, including reduced-sodium. Steak sauce. Canned and packaged gravies. Fish sauce. Oyster sauce. Cocktail sauce. Horseradish that you find on the shelf. Ketchup. Mustard. Meat flavorings and tenderizers. Bouillon cubes. Hot sauce and Tabasco sauce. Premade or packaged marinades. Premade or packaged taco seasonings. Relishes. Regular salad dressings. Where to find more information: National Heart, Lung, and [place]: https://wilson-eaton.com/ American Heart Association: www.heart.org Summary The DASH eating plan is a  healthy eating plan that has been shown to reduce high blood pressure (hypertension). It may also reduce your risk for type 2 diabetes, heart disease, and stroke. With the DASH eating plan, you should limit salt (sodium) intake to 2,300 mg a day. If you have hypertension, you may need to reduce your sodium intake to  1,500 mg a day. When on the DASH eating plan, aim to eat more fresh fruits and vegetables, whole grains, lean proteins, low-fat dairy, and heart-healthy fats. Work with your health care provider or diet and nutrition specialist (dietitian) to adjust your eating plan to your individual calorie needs. This information is not intended to replace advice given to you by your health care provider. Make sure you discuss any questions you have with your health care provider. Document Released: 01/30/2011 Document Revised: 01/23/2017 Document Reviewed: 02/04/2016 Elsevier Patient Education  2020 [employer].

## 2022-03-31 NOTE — Research (Signed)
  Subject Name: Mike [name] met inclusion and exclusion criteria for the Virtual Care and Social Determinant Interventions for the management of hypertension trial.  The informed consent form, study requirements and expectations were reviewed with the subject by Dr. Oval Linsey and myself. The subject was given the opportunity to read the consent and ask questions. The subject verbalized understanding of the trial requirements.  All questions were addressed prior to the signing of the consent form. The subject agreed to participate in the trial and signed the informed consent. The informed consent was obtained prior to performance of any protocol-specific procedures for the subject.  A copy of the signed informed consent was given to the subject and a copy was placed in the subject's medical record.  Mike [name] was randomized to Group 2.

## 2022-03-31 NOTE — Progress Notes (Signed)
Advanced Hypertension Clinic Initial Assessment:    Date:  03/31/2022   ID:  Mike [name], DOB [birth year], MRN [id number]  PCP:  Isaac Bliss, Rayford Halsted, MD  Cardiologist:  None   Referring MD: Isaac Bliss, Mike [name]*   CC: Hypertension  History of Present Illness:    Mike [name] is a 52 y.o. male with a hx of morbid obesity, gout, prior tobacco abuse, and hypertension here to establish care in the Advanced Hypertension Clinic.  He has been working with his PCP for better blood pressure control.  At his last visit he was on amlodipine, valsartan/HCTZ, and metoprolol.  He was taking his medications as prescribed but blood pressures remained uncontrolled both in the office and at home.  Mr. Prindle reports that his blood pressure has been high since his high school years. He notes that while it was previously under control, it has recently increased due to heightened stress from his job and the loss of his children's mother in November. He acknowledges that he has not been exercising recently but intends to start walking again and persist with dietary modifications. He does not add salt to his food, though he recognizes that he consumes sodium through condiments like lemon pepper and salad dressings.  The patient denies significant caffeine consumption but does consume about three alcoholic beverages daily, mainly post-work. He does not use tobacco. Mr. Nauta does not regularly snore and reports no sleep disturbances. His restfulness upon waking varies with his bedtime, as he is employed in two jobs.  Mr. Bott has a substantial family history of high blood pressure, with his mother, father, uncles, grandparents, and cousins all affected. He is uncertain of any family history of heart attacks but suspects his father may have experienced a stroke. Additionally, his father has a history of thyroid problems, neuropathy, lung cancer, and sleep apnea.  The patient has not been consistently  monitoring his blood pressure but remembers readings typically in the 277A systolic when he did check. He denies any issues with leg swelling or pain. No recent tests have been conducted to determine the cause of his high blood pressure, which was tentatively diagnosed as hypertension during his teenage years.   Previous antihypertensives: Lisinopril losartan   Past Medical History:  Diagnosis Date   Gout 03/25/2019   Uric acid 9.0 January 2021.  Starting allopurinol.  Prescribed colchicine for gout prophylaxis for the first 1-3 months.   Hypertension    Morbid obesity ([place])    MRSA infection    11 years ago    History reviewed. No pertinent surgical history.  Current Medications: Current Meds  Medication Sig   allopurinol (ZYLOPRIM) 300 MG tablet TAKE 1 TABLET (300 MG TOTAL) BY MOUTH DAILY. TO LOWER URIC ACID TO PREVENT GOUT LONG-TERM.   carvedilol (COREG) 25 MG tablet Take 1 tablet (25 mg total) by mouth 2 (two) times daily.   Ginkgo 60 MG TABS Take by mouth.   Olmesartan-amLODIPine-HCTZ 40-10-25 MG TABS Take 1 tablet by mouth daily.   Turmeric 500 MG CAPS Take by mouth.   [DISCONTINUED] amLODipine (NORVASC) 5 MG tablet TAKE 1 TABLET (5 MG TOTAL) BY MOUTH DAILY.   [DISCONTINUED] metoprolol tartrate (LOPRESSOR) 50 MG tablet TAKE 1 TABLET BY MOUTH TWICE A DAY   [DISCONTINUED] valsartan-hydrochlorothiazide (DIOVAN-HCT) 320-25 MG tablet Take 1 tablet by mouth daily.   Current Facility-Administered Medications for the 03/31/22 encounter (Office Visit) with Skeet Latch, MD  Medication   0.9 %  sodium chloride infusion  Allergies:   Lisinopril   Social History   Socioeconomic History   Marital status: Married    Spouse name: Not on file   Number of children: Not on file   Years of education: Not on file   Highest education level: 12th grade  Occupational History   Not on file  Tobacco Use   Smoking status: Former    Types: Cigarettes   Smokeless tobacco: Never    Tobacco comments:    Greater than 10 years ago  Vaping Use   Vaping Use: Never used  Substance and Sexual Activity   Alcohol use: Yes    Alcohol/week: 21.0 standard drinks of alcohol    Types: 21 Cans of beer per week   Drug use: Not Currently    Comment: greater than 10 years marijuana use   Sexual activity: Yes  Other Topics Concern   Not on file  Social History Narrative   Not on file   Social Determinants of Health   Financial Resource Strain: Low Risk  (03/03/2022)   Overall Financial Resource Strain (CARDIA)    Difficulty of Paying Living Expenses: Not hard at all  Food Insecurity: No Food Insecurity (03/03/2022)   Hunger Vital Sign    Worried About Running Out of Food in the Last Year: Never true    Ran Out of Food in the Last Year: Never true  Transportation Needs: No Transportation Needs (03/03/2022)   PRAPARE - Hydrologist (Medical): No    Lack of Transportation (Non-Medical): No  Physical Activity: Insufficiently Active (03/03/2022)   Exercise Vital Sign    Days of Exercise per Week: 2 days    Minutes of Exercise per Session: 10 min  Stress: No Stress Concern Present (03/03/2022)   [place]    Feeling of Stress : Not at all  Social Connections: Moderately Isolated (03/03/2022)   Social Connection and Isolation Panel [NHANES]    Frequency of Communication with Friends and Family: More than three times a week    Frequency of Social Gatherings with Friends and Family: Never    Attends Religious Services: Never    Printmaker: No    Attends Music therapist: Not on file    Marital Status: Married     Family History: The patient's family history includes Diabetes in his brother, father, and mother; Hypertension in his father, maternal aunt, maternal grandfather, maternal grandmother, maternal uncle, and mother; Lung cancer in his father;  Stroke in his father; Thyroid disease in his father. There is no history of Colon cancer, Esophageal cancer, Rectal cancer, or Stomach cancer.  ROS:   Please see the history of present illness.     All other systems reviewed and are negative.  EKGs/Labs/Other Studies Reviewed:    EKG:  EKG is ordered today.  The ekg ordered today demonstrates sinus tachycardia.  Rate 102 bpm.    Recent Labs: 03/06/2022: ALT 29; BUN 17; Creatinine, Ser 0.90; Hemoglobin 14.6; Platelets 205.0; Potassium 3.8; Sodium 139   Recent Lipid Panel    Component Value Date/Time   CHOL 178 03/06/2022 0803   TRIG 75.0 03/06/2022 0803   HDL 68.10 03/06/2022 0803   CHOLHDL 3 03/06/2022 0803   VLDL 15.0 03/06/2022 0803   LDLCALC 95 03/06/2022 0803    Physical Exam:   VS:  BP (!) 146/91 (BP Location: Left Arm, Patient Position: Sitting, Cuff Size: Large)  Pulse (!) 102   Ht 6' (1.829 m)   Wt (!) 323 lb 4.8 oz (146.6 kg)   BMI 43.85 kg/m  , BMI Body mass index is 43.85 kg/m. GENERAL:  Well appearing HEENT: Pupils equal round and reactive, fundi not visualized, oral mucosa unremarkable NECK:  No jugular venous distention, waveform within normal limits, carotid upstroke brisk and symmetric, no bruits, no thyromegaly LYMPHATICS:  No cervical adenopathy LUNGS:  Clear to auscultation bilaterally HEART:  RRR.  PMI not displaced or sustained,S1 and S2 within normal limits, no S3, no S4, no clicks, no rubs, no murmurs ABD:  Flat, positive bowel sounds normal in frequency in pitch, no bruits, no rebound, no guarding, no midline pulsatile mass, no hepatomegaly, no splenomegaly EXT:  2 plus pulses throughout, no edema, no cyanosis no clubbing SKIN:  No rashes no nodules NEURO:  Cranial nerves II through XII grossly intact, motor grossly intact throughout PSYCH:  Cognitively intact, oriented to person place and time   ASSESSMENT/PLAN:    Resistant Hypertension: - Transition amlodipine, valsartand and HCTZ to  amlodipine/olmesartan/HCTZ at 10/40/'25mg'$  daily for ease of administration. - Transition to metoprolol to carvedilol '25mg'$  bid for enhanced blood pressure management - Instruct patient to monitor blood pressure twice daily for the next month - He is interested in our remote patient monitoring study and consents to monitoring in the [facility] system - Given his young age at diagnosis and resistant HTN, check renal artery Dopplers, renin, aldosterone  Obesity: Lifestyle Modifications: - Advise patient to limit alcohol intake to a maximum of two drinks per day and no more than fourteen drinks per week - Recommend a goal of 150 minutes of moderate exercise per week - Continue to avoid added salt and carefully monitor sodium consumption from condiments - Provide patient with educational resources on managing high blood pressure   Screening for Secondary Hypertension:     03/31/2022    3:48 PM  Causes  Drugs/Herbals Screened     - Comments limits sodium.  no tobacco.  + EtOH/day.  Renovascular HTN Screened     - Comments check renal artery Dopplers  Sleep Apnea Screened     - Comments no snoring or apnea.  Thyroid Disease Screened  Hyperaldosteronism Screened     - Comments check renin and aldosterone  Pheochromocytoma N/A  Cushing's Syndrome N/A  Hyperparathyroidism Screened  Coarctation of the Aorta Screened     - Comments BP symmetric  Compliance Screened    Relevant Labs/Studies:    Latest Ref Rng & Units 03/06/2022    8:03 AM 08/17/2020   10:07 AM 03/25/2019    9:32 AM  Basic Labs  Sodium 135 - 145 mEq/L 139  136  135   Potassium 3.5 - 5.1 mEq/L 3.8  4.0  3.8   Creatinine 0.40 - 1.50 mg/dL 0.90  0.87  0.88        Latest Ref Rng & Units 08/17/2020   10:07 AM 09/24/2018    8:51 AM  Thyroid   TSH 0.35 - 4.50 uIU/mL 3.58  2.48              Latest Ref Rng & Units 07/21/2009    6:00 PM  Cortisol  Cortisol  ug/dL 51.6 (NOTE)  AM:  4.3 - 22.4 ug/dL PM:  3.1 - 16.7 ug/dL        Disposition:    FU with MD/PharmD in 1 month    Medication Adjustments/Labs and Tests Ordered: Current medicines are  reviewed at length with the patient today.  Concerns regarding medicines are outlined above.  Orders Placed This Encounter  Procedures   Aldosterone + renin activity w/ ratio   EKG 12-Lead   VAS US RENAL ARTERY DUPLEX   Meds ordered this encounter  Medications   Olmesartan-amLODIPine-HCTZ 40-10-25 MG TABS    Sig: Take 1 tablet by mouth daily.    Dispense:  90 tablet    Refill:  1    D/C VALSARTAN HCT   carvedilol (COREG) 25 MG tablet    Sig: Take 1 tablet (25 mg total) by mouth 2 (two) times daily.    Dispense:  180 tablet    Refill:  3    D/C METOPROLOL     Signed, Skeet Latch, MD  03/31/2022 4:41 PM    [place]

## 2022-04-04 ENCOUNTER — Telehealth: Payer: Self-pay

## 2022-04-04 DIAGNOSIS — Z006 Encounter for examination for normal comparison and control in clinical research program: Secondary | ICD-10-CM

## 2022-04-04 NOTE — Telephone Encounter (Signed)
Called to check on pt since pt did not have any b/p readings in [place]. Pt stated his father passed on May 29, 2022. Pt has been busy making funeral arrangements. Pt picked up b/p medication yesterday from pharmacy and pt will start using the [employer] app next week. I will call and check on pt next week.

## 2022-04-05 ENCOUNTER — Other Ambulatory Visit: Payer: Self-pay | Admitting: Internal Medicine

## 2022-04-05 DIAGNOSIS — I10 Essential (primary) hypertension: Secondary | ICD-10-CM

## 2022-04-08 ENCOUNTER — Telehealth: Payer: Self-pay

## 2022-04-08 DIAGNOSIS — Z0000 Encounter for general adult medical examination without abnormal findings: Secondary | ICD-10-CM

## 2022-04-08 NOTE — Telephone Encounter (Signed)
Called patient to conduct Welcome Call and offer health coaching. Patient stated that he wasn't able to follow his eating and physical activity plans due to his father passing and having to help with arrangements and care for his mother. Patient stated that once this stressful period is over, he will be able to resume his healthy habits. Patient requested that his prompt times be changed to 5am and 9pm due to his work schedule. Patient's prompt times have been updated. Patient is aware that he can check his notifications for messages from nurses that will be monitoring his readings.    Avelino Leeds, MS, ERHD, [facility]  Care Guide, Health & Wellness Coach [address], [address] [place] [address] Telephone: [phone] Email: [email or web address]

## 2022-04-10 ENCOUNTER — Ambulatory Visit (HOSPITAL_COMMUNITY)
Admission: RE | Admit: 2022-04-10 | Discharge: 2022-04-10 | Disposition: A | Discharge status: Home / Self Care | Payer: 59 | Source: Ambulatory Visit | Attending: Cardiovascular Disease | Admitting: Cardiovascular Disease

## 2022-04-10 DIAGNOSIS — I10 Essential (primary) hypertension: Secondary | ICD-10-CM

## 2022-04-18 ENCOUNTER — Telehealth (HOSPITAL_BASED_OUTPATIENT_CLINIC_OR_DEPARTMENT_OTHER): Payer: Self-pay | Admitting: Cardiovascular Disease

## 2022-04-18 NOTE — Telephone Encounter (Signed)
Discussed with Mike [name] and she knows how to change, will forward to her to address

## 2022-04-18 NOTE — Telephone Encounter (Signed)
Patient is calling bout the bp program he is in, its okay that he dont take his bp at a certain time. With his work schd unable to take at the designated time. Please advise

## 2022-04-18 NOTE — Telephone Encounter (Signed)
Spoke with patient and advised ok to check next couple of days when it was convenient for him secondary to part time job

## 2022-04-28 ENCOUNTER — Telehealth (HOSPITAL_BASED_OUTPATIENT_CLINIC_OR_DEPARTMENT_OTHER): Payer: Self-pay | Admitting: *Deleted

## 2022-04-28 DIAGNOSIS — I701 Atherosclerosis of renal artery: Secondary | ICD-10-CM

## 2022-04-28 DIAGNOSIS — I10 Essential (primary) hypertension: Secondary | ICD-10-CM

## 2022-04-28 DIAGNOSIS — Z01812 Encounter for preprocedural laboratory examination: Secondary | ICD-10-CM

## 2022-04-28 NOTE — Telephone Encounter (Signed)
Left message to call back  

## 2022-04-28 NOTE — Telephone Encounter (Signed)
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   Recommend getting a CT-A of the abdomen to better determine if this is significant.

## 2022-05-06 LAB — ALDOSTERONE + RENIN ACTIVITY W/ RATIO
Aldos/Renin Ratio: 0.1 (ref 0.0–30.0)
Aldosterone: 3.3 ng/dL (ref 0.0–30.0)
Renin Activity, Plasma: 53.377 ng/mL/hr — ABNORMAL HIGH (ref 0.167–5.380)

## 2022-05-15 NOTE — Telephone Encounter (Signed)
Followed up with patient since no return call, advised of results  Message to scheduling to reach out

## 2022-05-16 ENCOUNTER — Telehealth (HOSPITAL_BASED_OUTPATIENT_CLINIC_OR_DEPARTMENT_OTHER): Payer: Self-pay | Admitting: Cardiovascular Disease

## 2022-05-16 NOTE — Telephone Encounter (Signed)
Spoke with patient regarding the 05/29/22 7:00 am CTA abdomen/pelvis with/wo contrast sched at [facility]---arrive time is 6:45 am for check in and there are not restrictions.  Patient to have lab work done the week of 05/18/22 at [facility].  Will mail information to patient and he voiced his understanding.

## 2022-05-22 LAB — BASIC METABOLIC PANEL
BUN/Creatinine Ratio: 17 (ref 9–20)
BUN: 15 mg/dL (ref 6–24)
CO2: 24 mmol/L (ref 20–29)
Calcium: 9.4 mg/dL (ref 8.7–10.2)
Chloride: 102 mmol/L (ref 96–106)
Creatinine, Ser: 0.87 mg/dL (ref 0.76–1.27)
Glucose: 83 mg/dL (ref 70–99)
Potassium: 3.9 mmol/L (ref 3.5–5.2)
Sodium: 140 mmol/L (ref 134–144)
eGFR: 104 mL/min/{1.73_m2} (ref >59)

## 2022-05-28 ENCOUNTER — Other Ambulatory Visit (HOSPITAL_BASED_OUTPATIENT_CLINIC_OR_DEPARTMENT_OTHER): Payer: Self-pay | Admitting: *Deleted

## 2022-05-28 DIAGNOSIS — I701 Atherosclerosis of renal artery: Secondary | ICD-10-CM

## 2022-05-29 ENCOUNTER — Encounter (HOSPITAL_BASED_OUTPATIENT_CLINIC_OR_DEPARTMENT_OTHER): Payer: Self-pay

## 2022-05-29 ENCOUNTER — Ambulatory Visit (INDEPENDENT_AMBULATORY_CARE_PROVIDER_SITE_OTHER): Payer: 59 | Admitting: Pharmacist Clinician (PhC)/ Clinical Pharmacy Specialist

## 2022-05-29 ENCOUNTER — Ambulatory Visit (HOSPITAL_BASED_OUTPATIENT_CLINIC_OR_DEPARTMENT_OTHER)
Admission: RE | Admit: 2022-05-29 | Discharge: 2022-05-29 | Disposition: A | Discharge status: Home / Self Care | Payer: 59 | Source: Ambulatory Visit | Attending: Cardiovascular Disease | Admitting: Cardiovascular Disease

## 2022-05-29 ENCOUNTER — Encounter: Payer: Self-pay | Admitting: Pharmacist Clinician (PhC)/ Clinical Pharmacy Specialist

## 2022-05-29 VITALS — BP 145/94 | HR 89

## 2022-05-29 DIAGNOSIS — I10 Essential (primary) hypertension: Secondary | ICD-10-CM

## 2022-05-29 DIAGNOSIS — I701 Atherosclerosis of renal artery: Secondary | ICD-10-CM | POA: Insufficient documentation

## 2022-05-29 MED ORDER — IOHEXOL 350 MG/ML SOLN
100.0000 mL | Freq: Once | INTRAVENOUS | Status: AC | PRN
  Administered 2022-05-29: 75 mL via INTRAVENOUS

## 2022-05-29 NOTE — Patient Instructions (Signed)
Follow up appointment: June 6 at 8:45 am with Dr. Oval Linsey at the [facility] office  Take your BP meds as follows:  continue with amlodipine/olmesartan/hctz  [phone]  Check your blood pressure at home daily (if able) and keep record of the readings.  Hypertension "High blood pressure"  Hypertension is often called "The Silent Killer." It rarely causes symptoms until it is extremely  high or has done damage to other organs in the body. For this reason, you should have your  blood pressure checked regularly by your physician. We will check your blood pressure  every time you see a provider at one of our offices.   Your blood pressure reading consists of two numbers. Ideally, blood pressure should be  below 120/80. The first ("top") number is called the systolic pressure. It measures the  pressure in your arteries as your heart beats. The second ("bottom") number is called the diastolic pressure. It measures the pressure in your arteries as the heart relaxes between beats.  The benefits of getting your blood pressure under control are enormous. A 10-point  reduction in systolic blood pressure can reduce your risk of stroke by 27% and heart failure by 28%  Your blood pressure goal is < 130/80  To check your pressure at home you will need to:  1. Sit up in a chair, with feet flat on the floor and back supported. Do not cross your ankles or legs. 2. Rest your left arm so that the cuff is about heart level. If the cuff goes on your upper arm,  then just relax the arm on the table, arm of the chair or your lap. If you have a wrist cuff, we  suggest relaxing your wrist against your chest (think of it as Pledging the Flag with the  wrong arm).  3. Place the cuff snugly around your arm, about 1 inch above the crook of your elbow. The  cords should be inside the groove of your elbow.  4. Sit quietly, with the cuff in place, for about 5 minutes. After that 5 minutes press the power  button  to start a reading. 5. Do not talk or move while the reading is taking place.  6. Record your readings on a sheet of paper. Although most cuffs have a memory, it is often  easier to see a pattern developing when the numbers are all in front of you.  7. You can repeat the reading after 1-3 minutes if it is recommended  Make sure your bladder is empty and you have not had caffeine or tobacco within the last 30 min  Always bring your blood pressure log with you to your appointments. If you have not brought your monitor in to be double checked for accuracy, please bring it to your next appointment.  You can find a list of quality blood pressure cuffs at validatebp.org

## 2022-05-29 NOTE — Progress Notes (Signed)
Office Visit    Patient Name: Mike [name] Date of Encounter: 05/29/2022  Primary Care Provider:  Isaac Bliss, Rayford Halsted, MD Primary Cardiologist:  None  Chief Complaint    Hypertension - Advanced hypertension clinic  Past Medical History   obesity BMI 43.8  gout Uses allopurinol daily  Vit D deficiency Now at 35.4    Allergies  Allergen Reactions   Lisinopril     Swelling around eyes    History of Present Illness    Mike [name] is a 52 y.o. male patient who was referred to the Advanced Hypertension Clinic by Dr. Isaac Bliss.  Mike [name] notes that he first was diagnosed with hypertension while playing football in high school, but was not on medications immediately.  His pressure has gone up and down over the years, in part due to stresses in life.   When he saw Dr. Oval Linsey his pressure was 146/91.  He was on valsartan hctz, amlodipine and metoprolol at that time and she switched him to amlodipine/olmesartan/hctz once daily as well as carvedilol 25 mg bid.  There was some mix-up and the carvedilol was never filled.  He has only been on the triple tablet for the past 2 months.    Today he is in the office for his follow up visit.  In vivify his home readings are looking well controlled, averaging in the normal range, with only a few elevated systolic readings.   Diastolic readings still slightly elevated, but getting more < 80.  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  He notes that he has been checking home BP readings on his right arm because it has better readings, but in office today they were almost identical.     Blood Pressure Goal:  130/80  Current Medications:  valsartan/hctz/amlodipine 10/40/25   Adherence Assessment  Do you ever forget to take your medication? [] Yes [x] No  Do you ever skip doses due to side effects? [] Yes [x] No  Do you have trouble affording your medicines?  [] Yes [x] No  Are you ever unable to pick up your medication due to transportation difficulties? [] Yes [x] No  Do you ever stop taking your medications because you don't believe they are helping? [] Yes [x] No   Previously tried:   lisinopril - facial edema  Family Hx:   father recently deceased, mother with hypertension,; one brother with controlled BP (has CDL so watches closely) other brother doesn't go to doctors;  daughters 7 and 60 no issues yet  Social Hx:      Tobacco:no  Alcohol:  1-4 drinks/day   Caffeine:  no soda  Diet:    doesn't eat out; all meals at home; plenty of fresh vegetables, limits canned items; no pork, protein from chichen, tuna [place]; doesn't care for sweets  Exercise: walking mainly, some light weight training now 3 days per week, trying to increase   Home BP readings: in [place] 14 day average 122/80  HR 94  (range 111-132/68-89) 30 day average 124/82  HR 97  (range 111-135/68-91)       Accessory Clinical Findings    Lab Results  Component Value Date   CREATININE 0.87 05/22/2022   BUN 15 05/22/2022   NA 140 05/22/2022   K 3.9 05/22/2022   CL 102 05/22/2022   CO2 24 05/22/2022   Lab Results  Component Value Date   ALT 29 03/06/2022   AST 26 03/06/2022  ALKPHOS 60 03/06/2022   BILITOT 0.3 03/06/2022   Lab Results  Component Value Date   HGBA1C 5.4 03/06/2022    Screening for Secondary Hypertension:      03/31/2022    3:48 PM  Causes  Drugs/Herbals Screened     - Comments limits sodium.  no tobacco.  + EtOH/day.  Renovascular HTN Screened     - Comments check renal artery Dopplers  Sleep Apnea Screened     - Comments no snoring or apnea.  Thyroid Disease Screened  Hyperaldosteronism Screened     - Comments check renin and aldosterone  Pheochromocytoma N/A  Cushing's Syndrome N/A  Hyperparathyroidism Screened  Coarctation of the Aorta Screened     - Comments BP symmetric  Compliance Screened    Relevant Labs/Studies:    Latest  Ref Rng & Units 05/22/2022    9:33 AM 03/06/2022    8:03 AM 08/17/2020   10:07 AM  Basic Labs  Sodium 134 - 144 mmol/L 140  139  136   Potassium 3.5 - 5.2 mmol/L 3.9  3.8  4.0   Creatinine 0.76 - 1.27 mg/dL 0.87  0.90  0.87        Latest Ref Rng & Units 08/17/2020   10:07 AM 09/24/2018    8:51 AM  Thyroid   TSH 0.35 - 4.50 uIU/mL 3.58  2.48        Latest Ref Rng & Units 04/29/2022    1:24 PM  Renin/Aldosterone   Aldosterone 0.0 - 30.0 ng/dL 3.3   Aldos/Renin Ratio 0.0 - 30.0 0.1           Latest Ref Rng & Units 07/21/2009    6:00 PM  Cortisol  Cortisol  ug/dL 51.6 (NOTE)  AM:  4.3 - 22.4 ug/dL PM:  3.1 - 16.7 ug/dL        04/10/2022   10:22 AM  Renovascular   Renal Artery [place] Completed Yes      Home Medications    Current Outpatient Medications  Medication Sig Dispense Refill   allopurinol (ZYLOPRIM) 300 MG tablet TAKE 1 TABLET (300 MG TOTAL) BY MOUTH DAILY. TO LOWER URIC ACID TO PREVENT GOUT LONG-TERM. 90 tablet 1   Ginkgo 60 MG TABS Take by mouth.     Olmesartan-amLODIPine-HCTZ 40-10-25 MG TABS Take 1 tablet by mouth daily. 90 tablet 1   Turmeric 500 MG CAPS Take by mouth.     Current Facility-Administered Medications  Medication Dose Route Frequency Provider Last Rate Last Admin   0.9 %  sodium chloride infusion  500 mL Intravenous Once Mansouraty, Telford Nab., MD         Assessment & Plan   HYPERTENSION CONTROL Vitals:   05/29/22 0803 05/29/22 0810  BP: (!) 145/93 (!) 145/94    The patient's blood pressure is elevated above target today.  In order to address the patient's elevated BP: The blood pressure is usually elevated in clinic.  Blood pressures monitored at home have been optimal.      Hypertension Assessment: BP is uncontrolled in office BP 145/94 mmHg;  above the goal (<130/80). Did not start carvedilol after last appointment - states only one prescription at pharmacy Tolerates amlodipine/olmesartan/hctz well without any side effects Denies  SOB, palpitation, chest pain, headaches,or swelling Reviewed results of secondary testing  Appears to have some white coat hypertension, as Vivify readings show normal pressures at home Reiterated the importance of regular exercise and low salt diet   Plan:  Continue taking amlodipine/olmesartan/hctz  Patient to keep record of BP readings with heart rate and report to [place] at the next visit Patient to follow up with Dr. Oval Linsey in 2 months  Labs ordered today:  none   Tommy Medal PharmD CPP [facility]  [address] [place], [place] [address] [phone]

## 2022-05-29 NOTE — Assessment & Plan Note (Signed)
Assessment: BP is uncontrolled in office BP 145/94 mmHg;  above the goal (<130/80). Did not start carvedilol after last appointment - states only one prescription at pharmacy Tolerates amlodipine/olmesartan/hctz well without any side effects Denies SOB, palpitation, chest pain, headaches,or swelling Reviewed results of secondary testing  Appears to have some white coat hypertension, as Vivify readings show normal pressures at home Reiterated the importance of regular exercise and low salt diet   Plan:  Continue taking amlodipine/olmesartan/hctz Patient to keep record of BP readings with heart rate and report to [place] at the next visit Patient to follow up with Dr. Oval Linsey in 2 months  Labs ordered today:  none

## 2022-06-26 ENCOUNTER — Telehealth: Payer: Self-pay | Admitting: Cardiovascular Disease

## 2022-06-26 ENCOUNTER — Other Ambulatory Visit: Payer: Self-pay | Admitting: Internal Medicine

## 2022-06-26 DIAGNOSIS — I10 Essential (primary) hypertension: Secondary | ICD-10-CM

## 2022-06-26 MED ORDER — OLMESARTAN-AMLODIPINE-HCTZ 40-10-25 MG PO TABS: 1.0000 | ORAL_TABLET | Freq: Every day | ORAL | 1 refills | Status: DC

## 2022-06-26 NOTE — Telephone Encounter (Signed)
*  STAT* If patient is at the pharmacy, call can be transferred to refill team.   1. Which medications need to be refilled? (please list name of each medication and dose if known)   Olmesartan-amLODIPine-HCTZ 40-10-25 MG TABS    2. Which pharmacy/location (including street and city if local pharmacy) is medication to be sent to?   [id number] - [place], [place] - [address]    3. Do they need a 30 day or 90 day supply? 30

## 2022-06-27 NOTE — Telephone Encounter (Signed)
Rx request sent to pharmacy.  

## 2022-07-24 ENCOUNTER — Telehealth: Payer: Self-pay

## 2022-07-24 DIAGNOSIS — Z0000 Encounter for general adult medical examination without abnormal findings: Secondary | ICD-10-CM

## 2022-07-24 NOTE — Telephone Encounter (Signed)
Called patient to determine if he was having issues with his device. Patient stated that he is not able to login due to the security update. Submitted a ticket to [employer] support for follow up. Provided patient with case [id number] and Viv[employer] contact number in case he misses the call.    Renaee Munda, MS, ERHD, [facility]  Care Guide, Health & Wellness Coach [address], [address] [place] [address] Telephone: [phone] Email: [email or web address]

## 2022-07-31 ENCOUNTER — Ambulatory Visit (INDEPENDENT_AMBULATORY_CARE_PROVIDER_SITE_OTHER): Payer: 59 | Admitting: Cardiovascular Disease

## 2022-07-31 ENCOUNTER — Encounter (HOSPITAL_BASED_OUTPATIENT_CLINIC_OR_DEPARTMENT_OTHER): Payer: Self-pay | Admitting: Cardiovascular Disease

## 2022-07-31 VITALS — BP 145/92 | HR 94 | Ht 72.0 in | Wt 335.5 lb

## 2022-07-31 DIAGNOSIS — I10 Essential (primary) hypertension: Secondary | ICD-10-CM | POA: Diagnosis not present

## 2022-07-31 NOTE — Patient Instructions (Addendum)
Medication Instructions:  Your physician recommends that you continue on your current medications as directed. Please refer to the Current Medication list given to you today.   Labwork: NONE  Testing/Procedures: NONE  Follow-Up: 10/02/2022 8:25 am with Ronn Melena NP  8  MONTHS WITH DR [facility]   Any Other Special Instructions Will Be Listed Below (If Applicable). Exercise recommendations: The American Heart Association recommends 150 minutes of moderate intensity exercise weekly. Try 30 minutes of moderate intensity exercise 4-5 times per week. This could include walking, jogging, or swimming.  CONTINUE TO MONITOR YOUR BLOOD PRESSURE TWICE A DAY. BRING YOUR STUDY MACHINE TO FOLLOW UP   If you need a refill on your cardiac medications before your next appointment, please call your pharmacy.

## 2022-07-31 NOTE — Addendum Note (Signed)
Addended by: Regis Bill B on: 07/31/2022 09:55 AM   Modules accepted: Orders

## 2022-07-31 NOTE — Progress Notes (Signed)
Advanced Hypertension Clinic follow-up:   Date:  07/31/2022   ID:  Mike [name], DOB 02-Oct-1970, MRN [id number]  PCP:  Philip Aspen, Limmie Patricia, MD  Cardiologist:  None   Referring MD: Philip Aspen, Estel*   CC: Hypertension  History of Present Illness:    Mike [name] is a 52 y.o. male with a hx of morbid obesity, gout, prior tobacco abuse, and hypertension here for follow-up.  He was first seen in the Advanced Hypertension Clinic 03/2022.  He noted that he first developed hypertension in high school.  He has been working with his PCP for better blood pressure control.  At his last visit he was on amlodipine, valsartan/HCTZ, and metoprolol.  He was taking his medications as prescribed but blood pressures remained uncontrolled both in the office and at home.  He was under a lot of stress from work and from the passing of his children's mother.  He expressed a desire to increase his physical activity.  He was switched to Tribenzor and metoprolol was changed to carvedilol.  Renal artery Dopplers 03/2022 were concerning for significant stenosis on the right and were normal on the left.  However, on CTA of the abdomen there is no significant stenosis identified.  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  Labs were negative for hyperaldosteronism.  He enrolled in our remote patient monitoring study.  At follow-up with our pharmacist blood pressure was uncontrolled in the office but well-controlled at home.  Of note, he never started the carvedilol because he stated it was not available when he went to the pharmacy.  Mike [name] reports improvement in his overall condition. However, he experiences anxiety during office visits, which may affect his posture and blood pressure readings. He has been monitoring his blood pressure at home using a Vimify machine, with recent readings averaging around 120s over 80s. Mike [name] acknowledges that he has gained some weight since his last visit and has not  been exercising regularly. He has been consuming vegetables but needs to incorporate more fruits into his diet.  He denies experiencing any chest pain or pressure during exercise and reports no change in his breathing. He also denies any significant swelling in his legs or feet, although he experiences some tightness after standing for long periods.     Previous antihypertensives: Lisinopril losartan   Past Medical History:  Diagnosis Date   Gout 03/25/2019   Uric acid 9.0 January 2021.  Starting allopurinol.  Prescribed colchicine for gout prophylaxis for the first 1-3 months.   Hypertension    Morbid obesity (HCC)    MRSA infection    11 years ago    History reviewed. No pertinent surgical history.  Current Medications: Current Meds  Medication Sig   allopurinol (ZYLOPRIM) 300 MG tablet TAKE 1 TABLET (300 MG TOTAL) BY MOUTH DAILY. TO LOWER URIC ACID TO PREVENT GOUT LONG-TERM.   Ginkgo 60 MG TABS Take by mouth.   Olmesartan-amLODIPine-HCTZ 40-10-25 MG TABS Take 1 tablet by mouth daily.   Turmeric 500 MG CAPS Take by mouth.   Current Facility-Administered Medications for the 07/31/22 encounter (Office Visit) with Chilton Si, MD  Medication   0.9 %  sodium chloride infusion     Allergies:   Lisinopril   Social History   Socioeconomic History   Marital status: Married    Spouse name: Not on file   Number of children: Not on file   Years of education: Not on file   Highest education  level: 12th grade  Occupational History   Not on file  Tobacco Use   Smoking status: Former    Types: Cigarettes   Smokeless tobacco: Never   Tobacco comments:    Greater than 10 years ago  Vaping Use   Vaping Use: Never used  Substance and Sexual Activity   Alcohol use: Yes    Alcohol/week: 21.0 standard drinks of alcohol    Types: 21 Cans of beer per week   Drug use: Not Currently    Comment: greater than 10 years marijuana use   Sexual activity: Yes  Other Topics Concern    Not on file  Social History Narrative   Not on file   Social Determinants of Health   Financial Resource Strain: Low Risk  (03/03/2022)   Overall Financial Resource Strain (CARDIA)    Difficulty of Paying Living Expenses: Not hard at all  Food Insecurity: No Food Insecurity (03/03/2022)   Hunger Vital Sign    Worried About Running Out of Food in the Last Year: Never true    Ran Out of Food in the Last Year: Never true  Transportation Needs: No Transportation Needs (03/03/2022)   PRAPARE - Administrator, Civil Service (Medical): No    Lack of Transportation (Non-Medical): No  Physical Activity: Insufficiently Active (03/03/2022)   Exercise Vital Sign    Days of Exercise per Week: 2 days    Minutes of Exercise per Session: 10 min  Stress: No Stress Concern Present (03/03/2022)   [employer] of Occupational Health - Occupational Stress Questionnaire    Feeling of Stress : Not at all  Social Connections: Moderately Isolated (03/03/2022)   Social Connection and Isolation Panel [NHANES]    Frequency of Communication with Friends and Family: More than three times a week    Frequency of Social Gatherings with Friends and Family: Never    Attends Religious Services: Never    Diplomatic Services operational officer: No    Attends Engineer, structural: Not on file    Marital Status: Married     Family History: The patient's family history includes Diabetes in his brother, father, and mother; Hypertension in his father, maternal aunt, maternal grandfather, maternal grandmother, maternal uncle, and mother; Lung cancer in his father; Stroke in his father; Thyroid disease in his father. There is no history of Colon cancer, Esophageal cancer, Rectal cancer, or Stomach cancer.  ROS:   Please see the history of present illness.     All other systems reviewed and are negative.  EKGs/Labs/Other Studies Reviewed:    EKG:  EKG is ordered today.  The ekg ordered today  demonstrates sinus tachycardia.  Rate 102 bpm.    Recent Labs: 03/06/2022: ALT 29; Hemoglobin 14.6; Platelets 205.0 05/22/2022: BUN 15; Creatinine, Ser 0.87; Potassium 3.9; Sodium 140   Recent Lipid Panel    Component Value Date/Time   CHOL 178 03/06/2022 0803   TRIG 75.0 03/06/2022 0803   HDL 68.10 03/06/2022 0803   CHOLHDL 3 03/06/2022 0803   VLDL 15.0 03/06/2022 0803   LDLCALC 95 03/06/2022 0803    Physical Exam:   VS:  BP (!) 145/92 (BP Location: Right Arm, Patient Position: Sitting, Cuff Size: Large)   Pulse 94   Ht 6' (1.829 m)   Wt (!) 335 lb 8 oz (152.2 kg)   SpO2 99%   BMI 45.50 kg/m  , BMI Body mass index is 45.5 kg/m. GENERAL:  Well appearing HEENT: Pupils equal  round and reactive, fundi not visualized, oral mucosa unremarkable NECK:  No jugular venous distention, waveform within normal limits, carotid upstroke brisk and symmetric, no bruits, no thyromegaly LUNGS:  Clear to auscultation bilaterally HEART:  RRR.  PMI not displaced or sustained,S1 and S2 within normal limits, no S3, no S4, no clicks, no rubs, no murmurs ABD:  Flat, positive bowel sounds normal in frequency in pitch, no bruits, no rebound, no guarding, no midline pulsatile mass, no hepatomegaly, no splenomegaly EXT:  2 plus pulses throughout, no edema, no cyanosis no clubbing SKIN:  No rashes no nodules NEURO:  Cranial nerves II through XII grossly intact, motor grossly intact throughout PSYCH:  Cognitively intact, oriented to person place and time   ASSESSMENT/PLAN:     # Hypertension: - Blood pressure today is 134/88, averaging 120s/80s on home monitoring. - Patient is currently on a combination pill of olmesartan, amlodipine, and hydrochlorothiazide. - Continue current medication regimen. Encourage patient to exercise for at least 150 minutes per week. Recheck blood pressure in the office in 2 months. Ensure a 50-day supply of medication, and refill as needed through pharmacy requests.  #  Sedentary lifestyle and weight gain: # Obesity:  - Patient reports no recent exercise and some weight gain since the last visit. - Encourage patient to start a regular exercise routine, aiming for at least 150 minutes per week. Provide guidance on incorporating more fruits into the diet.  # Leg swelling and discomfort: - Patient reports mild leg swelling and tightness after standing for long periods.  No HF symptoms - Recommend wearing compression socks during prolonged standing to prevent varicose veins and further swelling. Encourage patient to purchase compression socks at a medical supply store.     Screening for Secondary Hypertension:     03/31/2022    3:48 PM  Causes  Drugs/Herbals Screened     - Comments limits sodium.  no tobacco.  + EtOH/day.  Renovascular HTN Screened     - Comments check renal artery Dopplers  Sleep Apnea Screened     - Comments no snoring or apnea.  Thyroid Disease Screened  Hyperaldosteronism Screened     - Comments check renin and aldosterone  Pheochromocytoma N/A  Cushing's Syndrome N/A  Hyperparathyroidism Screened  Coarctation of the Aorta Screened     - Comments BP symmetric  Compliance Screened    Relevant Labs/Studies:    Latest Ref Rng & Units 05/22/2022    9:33 AM 03/06/2022    8:03 AM 08/17/2020   10:07 AM  Basic Labs  Sodium 134 - 144 mmol/L 140  139  136   Potassium 3.5 - 5.2 mmol/L 3.9  3.8  4.0   Creatinine 0.76 - 1.27 mg/dL 1.61  0.96  0.45        Latest Ref Rng & Units 08/17/2020   10:07 AM 09/24/2018    8:51 AM  Thyroid   TSH 0.35 - 4.50 uIU/mL 3.58  2.48        Latest Ref Rng & Units 04/29/2022    1:24 PM  Renin/Aldosterone   Aldosterone 0.0 - 30.0 ng/dL 3.3   Aldos/Renin Ratio 0.0 - 30.0 0.1           Latest Ref Rng & Units 07/21/2009    6:00 PM  Cortisol  Cortisol  ug/dL 40.9 (NOTE)  AM:  4.3 - 22.4 ug/dL PM:  3.1 - 81.1 ug/dL     Disposition:    FU with MD/PharmD in 1 month  Medication Adjustments/Labs  and Tests Ordered: Current medicines are reviewed at length with the patient today.  Concerns regarding medicines are outlined above.  No orders of the defined types were placed in this encounter.  No orders of the defined types were placed in this encounter.    Signed, Chilton Si, MD  07/31/2022 9:33 AM    [facility]

## 2022-10-02 ENCOUNTER — Encounter (HOSPITAL_BASED_OUTPATIENT_CLINIC_OR_DEPARTMENT_OTHER): Payer: 59 | Admitting: Family

## 2022-10-02 NOTE — Progress Notes (Deleted)
Zio x

## 2022-10-09 ENCOUNTER — Ambulatory Visit (INDEPENDENT_AMBULATORY_CARE_PROVIDER_SITE_OTHER): Payer: 59 | Admitting: Family

## 2022-10-09 ENCOUNTER — Encounter (HOSPITAL_BASED_OUTPATIENT_CLINIC_OR_DEPARTMENT_OTHER): Payer: Self-pay | Admitting: Family

## 2022-10-09 VITALS — BP 138/78 | HR 97 | Ht 72.0 in | Wt 337.0 lb

## 2022-10-09 DIAGNOSIS — I10 Essential (primary) hypertension: Secondary | ICD-10-CM

## 2022-10-09 MED ORDER — OLMESARTAN-AMLODIPINE-HCTZ 40-10-25 MG PO TABS: 1.0000 | ORAL_TABLET | Freq: Every day | ORAL | 1 refills | Status: DC

## 2022-10-09 NOTE — Patient Instructions (Addendum)
Medication Instructions:  Your physician recommends that you continue on your current medications as directed. Please refer to the Current Medication list given to you today.  If your blood pressure is consistently high when we check-in in about a month, we will consider adding Spironolactone.     Follow-Up: Follow up as scheduled

## 2022-10-09 NOTE — Progress Notes (Signed)
Advanced Hypertension Clinic Assessment:    Date:  10/09/2022   ID:  Mike [name], DOB 05/04/1970, MRN [id number]  PCP:  Philip Aspen, Limmie Patricia, MD  Cardiologist:  None  Nephrologist:  Referring MD: Philip Aspen, Estel*   CC: Hypertension  History of Present Illness:    Mike [name] is a 52 y.o. male with a hx of hypertension, obesity here to establish care in the Advanced Hypertension Clinic.   Establish with advanced hypertension clinic 03/2022.  He developed hypertension in high school.  Amlodipine, valsartan/hydrochlorothiazide, metoprolol were transition to Tribenzor and carvedilol.  Renal Dopplers 03/2022 concerning for significant stenosis on the right and normal on the left.  However, CTA abdomen no significant stenosis identified.  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  Of note he never started carvedilol as he reported it was not available he wanted to the pharmacy.  Blood pressure has been found to be better controlled at home than in clinic visits.  Last seen 07/31/2022 with BP 134/88 in clinic and averaging 120s over 80s at home.  His current medications amlodipine-olmesartan-HCTZ was continued.  He was encouraged to increase physical activity.  Presents today for follow-up independently. Notes he fell off checking his BP routinely. Enjoys weight lifting and walking, but has not been doing it recently. Reports no shortness of breath nor dyspnea on exertion. Reports no chest pain, pressure, or tightness. No  orthopnea, PND.  Notes occasional bilateral lower extremity edema by end of day if he stands on his feet a lot.  This is overall not bothersome.  Reports no palpitations.    Previous antihypertensives: Lisinopril Losartan  Past Medical History:  Diagnosis Date   Gout 03/25/2019   Uric acid 9.0 January 2021.  Starting allopurinol.  Prescribed colchicine for gout prophylaxis for the first 1-3 months.   Hypertension    Morbid obesity (HCC)    MRSA  infection    11 years ago    History reviewed. No pertinent surgical history.  Current Medications: Current Meds  Medication Sig   allopurinol (ZYLOPRIM) 300 MG tablet TAKE 1 TABLET (300 MG TOTAL) BY MOUTH DAILY. TO LOWER URIC ACID TO PREVENT GOUT LONG-TERM.   Ginkgo 60 MG TABS Take by mouth.   Turmeric 500 MG CAPS Take by mouth.   UNABLE TO FIND Take 3 capsules by mouth daily. Med Name: Magnesium-Calcium-Vitamin D   [DISCONTINUED] Olmesartan-amLODIPine-HCTZ 40-10-25 MG TABS Take 1 tablet by mouth daily.   Current Facility-Administered Medications for the 10/09/22 encounter (Office Visit) with Alver Sorrow, NP  Medication   0.9 %  sodium chloride infusion     Allergies:   Lisinopril   Social History   Socioeconomic History   Marital status: Married    Spouse name: Not on file   Number of children: Not on file   Years of education: Not on file   Highest education level: 12th grade  Occupational History   Not on file  Tobacco Use   Smoking status: Former    Types: Cigarettes   Smokeless tobacco: Never   Tobacco comments:    Greater than 10 years ago  Vaping Use   Vaping status: Never Used  Substance and Sexual Activity   Alcohol use: Yes    Alcohol/week: 21.0 standard drinks of alcohol    Types: 21 Cans of beer per week   Drug use: Not Currently    Comment: greater than 10 years marijuana use   Sexual activity: Yes  Other Topics  Concern   Not on file  Social History Narrative   Not on file   Social Determinants of Health   Financial Resource Strain: Low Risk  (03/03/2022)   Overall Financial Resource Strain (CARDIA)    Difficulty of Paying Living Expenses: Not hard at all  Food Insecurity: No Food Insecurity (03/03/2022)   Hunger Vital Sign    Worried About Running Out of Food in the Last Year: Never true    Ran Out of Food in the Last Year: Never true  Transportation Needs: No Transportation Needs (03/03/2022)   PRAPARE - Scientist, research (physical sciences) (Medical): No    Lack of Transportation (Non-Medical): No  Physical Activity: Insufficiently Active (03/03/2022)   Exercise Vital Sign    Days of Exercise per Week: 2 days    Minutes of Exercise per Session: 10 min  Stress: No Stress Concern Present (03/03/2022)   [employer] of Occupational Health - Occupational Stress Questionnaire    Feeling of Stress : Not at all  Social Connections: Moderately Isolated (03/03/2022)   Social Connection and Isolation Panel [NHANES]    Frequency of Communication with Friends and Family: More than three times a week    Frequency of Social Gatherings with Friends and Family: Never    Attends Religious Services: Never    Diplomatic Services operational officer: No    Attends Engineer, structural: Not on file    Marital Status: Married     Family History: The patient's family history includes Diabetes in his brother, father, and mother; Hypertension in his father, maternal aunt, maternal grandfather, maternal grandmother, maternal uncle, and mother; Lung cancer in his father; Stroke in his father; Thyroid disease in his father. There is no history of Colon cancer, Esophageal cancer, Rectal cancer, or Stomach cancer.  ROS:   Please see the history of present illness.     All other systems reviewed and are negative.  EKGs/Labs/Other Studies Reviewed:         Recent Labs: 03/06/2022: ALT 29; Hemoglobin 14.6; Platelets 205.0 05/22/2022: BUN 15; Creatinine, Ser 0.87; Potassium 3.9; Sodium 140   Recent Lipid Panel    Component Value Date/Time   CHOL 178 03/06/2022 0803   TRIG 75.0 03/06/2022 0803   HDL 68.10 03/06/2022 0803   CHOLHDL 3 03/06/2022 0803   VLDL 15.0 03/06/2022 0803   LDLCALC 95 03/06/2022 0803    Physical Exam:   VS:  BP 138/78   Pulse 97   Ht 6' (1.829 m)   Wt (!) 337 lb (152.9 kg)   BMI 45.71 kg/m  , BMI Body mass index is 45.71 kg/m. GENERAL:  Well appearing HEENT: Pupils equal round and reactive,  fundi not visualized, oral mucosa unremarkable NECK:  No jugular venous distention, waveform within normal limits, carotid upstroke brisk and symmetric, no bruits, no thyromegaly LYMPHATICS:  No cervical adenopathy LUNGS:  Clear to auscultation bilaterally HEART:  RRR.  PMI not displaced or sustained,S1 and S2 within normal limits, no S3, no S4, no clicks, no rubs, no murmurs ABD:  Flat, positive bowel sounds normal in frequency in pitch, no bruits, no rebound, no guarding, no midline pulsatile mass, no hepatomegaly, no splenomegaly EXT:  2 plus pulses throughout, no edema, no cyanosis no clubbing SKIN:  No rashes no nodules NEURO:  Cranial nerves II through XII grossly intact, motor grossly intact throughout PSYCH:  Cognitively intact, oriented to person place and time   ASSESSMENT/PLAN:    HTN -  BP mildly elevated in clinic.  Has not been checking routinely at home.  BP previously well-controlled on home readings.  He request to check at home and continue present regimen olmesartan-amlodipine-HCTZ 40 - 10 - 25 mg daily. Refill provided.  Check in via MyChart in 2 weeks.If BP not at goal consider addition of Spironolactone vs Coreg. Requests to complete Vivify RPM study today. Does have alternate BP cuff at home. Appreciate his participation.    LE edema - If stands for whole day will note mild edema but overall bothersome.  Likely element of dependent edema and venous insufficiency.  Continue HCTZ as part of his antihypertensive regimen but no indication for loop diuretic at this time . Obesity - Weight loss via diet and exercise encouraged. Discussed the impact being overweight would have on cardiovascular risk. Plans to return to walking and weight lifting.   Screening for Secondary Hypertension:     03/31/2022    3:48 PM  Causes  Drugs/Herbals Screened     - Comments limits sodium.  no tobacco.  + EtOH/day.  Renovascular HTN Screened     - Comments check renal artery Dopplers  Sleep  Apnea Screened     - Comments no snoring or apnea.  Thyroid Disease Screened  Hyperaldosteronism Screened     - Comments check renin and aldosterone  Pheochromocytoma N/A  Cushing's Syndrome N/A  Hyperparathyroidism Screened  Coarctation of the Aorta Screened     - Comments BP symmetric  Compliance Screened    Relevant Labs/Studies:    Latest Ref Rng & Units 05/22/2022    9:33 AM 03/06/2022    8:03 AM 08/17/2020   10:07 AM  Basic Labs  Sodium 134 - 144 mmol/L 140  139  136   Potassium 3.5 - 5.2 mmol/L 3.9  3.8  4.0   Creatinine 0.76 - 1.27 mg/dL 0.93  2.35  5.73        Latest Ref Rng & Units 08/17/2020   10:07 AM 09/24/2018    8:51 AM  Thyroid   TSH 0.35 - 4.50 uIU/mL 3.58  2.48        Latest Ref Rng & Units 04/29/2022    1:24 PM  Renin/Aldosterone   Aldosterone 0.0 - 30.0 ng/dL 3.3   Aldos/Renin Ratio 0.0 - 30.0 0.1           Latest Ref Rng & Units 07/21/2009    6:00 PM  Cortisol  Cortisol  ug/dL 22.0 (NOTE)  AM:  4.3 - 22.4 ug/dL PM:  3.1 - 25.4 ug/dL        2/70/6237   62:83 AM  Renovascular   Renal Artery [place] Completed Yes       Disposition:    FU with MD/PharmD in 2-3 months    Medication Adjustments/Labs and Tests Ordered: Current medicines are reviewed at length with the patient today.  Concerns regarding medicines are outlined above.  Orders Placed This Encounter  Procedures   Cantril's Ladder Assessment   Meds ordered this encounter  Medications   Olmesartan-amLODIPine-HCTZ 40-10-25 MG TABS    Sig: Take 1 tablet by mouth daily.    Dispense:  90 tablet    Refill:  1    D/C VALSARTAN HCT    Order Specific Question:   Supervising Provider    Answer:   Jodelle Red [[id number]]     Signed, Alver Sorrow, NP  10/09/2022 1:15 PM    [facility]

## 2022-11-04 ENCOUNTER — Encounter: Payer: Self-pay | Admitting: Internal Medicine

## 2022-11-04 ENCOUNTER — Ambulatory Visit (INDEPENDENT_AMBULATORY_CARE_PROVIDER_SITE_OTHER): Payer: 59 | Admitting: Internal Medicine

## 2022-11-04 VITALS — BP 130/88 | HR 105 | Temp 97.7°F | Wt 330.2 lb

## 2022-11-04 DIAGNOSIS — M5442 Lumbago with sciatica, left side: Secondary | ICD-10-CM

## 2022-11-04 DIAGNOSIS — Z23 Encounter for immunization: Secondary | ICD-10-CM | POA: Diagnosis not present

## 2022-11-04 MED ORDER — CYCLOBENZAPRINE HCL 5 MG PO TABS
5.0000 mg | ORAL_TABLET | Freq: Every evening | ORAL | 1 refills | Status: DC | PRN
Start: 2022-11-04 — End: 2023-01-12

## 2022-11-04 MED ORDER — MELOXICAM 7.5 MG PO TABS
7.5000 mg | ORAL_TABLET | Freq: Every day | ORAL | 0 refills | Status: DC
Start: 2022-11-04 — End: 2022-12-09

## 2022-11-04 NOTE — Addendum Note (Signed)
Addended by: Kern Reap B on: 11/04/2022 09:25 AM   Modules accepted: Orders

## 2022-11-04 NOTE — Progress Notes (Signed)
Established Patient Office Visit     CC/Reason for Visit: Lower back pain  HPI: Mike [name] is a 52 y.o. male who is coming in today for the above mentioned reasons.  For the past 3 weeks or so has been having increased left-sided lower back pain with radiation to his buttock and lateral thigh.  No bowel or bladder incontinence, no saddle anesthesia.  Requesting flu vaccine.  Past Medical/Surgical History: Past Medical History:  Diagnosis Date   Gout 03/25/2019   Uric acid 9.0 January 2021.  Starting allopurinol.  Prescribed colchicine for gout prophylaxis for the first 1-3 months.   Hypertension    Morbid obesity (HCC)    MRSA infection    11 years ago    No past surgical history on file.  Social History:  reports that he has quit smoking. His smoking use included cigarettes. He has never used smokeless tobacco. He reports current alcohol use of about 21.0 standard drinks of alcohol per week. He reports that he does not currently use drugs.  Allergies: Allergies  Allergen Reactions   Lisinopril     Swelling around eyes    Family History:  Family History  Problem Relation Age of Onset   Hypertension Mother    Diabetes Mother    Stroke Father    Hypertension Father    Diabetes Father    Lung cancer Father    Thyroid disease Father    Diabetes Brother    Hypertension Maternal Aunt    Hypertension Maternal Uncle    Hypertension Maternal Grandmother    Hypertension Maternal Grandfather    Colon cancer Neg Hx    Esophageal cancer Neg Hx    Rectal cancer Neg Hx    Stomach cancer Neg Hx      Current Outpatient Medications:    allopurinol (ZYLOPRIM) 300 MG tablet, TAKE 1 TABLET (300 MG TOTAL) BY MOUTH DAILY. TO LOWER URIC ACID TO PREVENT GOUT LONG-TERM., Disp: 90 tablet, Rfl: 1   cyclobenzaprine (FLEXERIL) 5 MG tablet, Take 1 tablet (5 mg total) by mouth at bedtime as needed for muscle spasms., Disp: 30 tablet, Rfl: 1   Ginkgo 60 MG TABS, Take by mouth.,  Disp: , Rfl:    meloxicam (MOBIC) 7.5 MG tablet, Take 1 tablet (7.5 mg total) by mouth daily., Disp: 30 tablet, Rfl: 0   Olmesartan-amLODIPine-HCTZ 40-10-25 MG TABS, Take 1 tablet by mouth daily., Disp: 90 tablet, Rfl: 1   Turmeric 500 MG CAPS, Take by mouth., Disp: , Rfl:    UNABLE TO FIND, Take 3 capsules by mouth daily. Med Name: Magnesium-Calcium-Vitamin D, Disp: , Rfl:   Current Facility-Administered Medications:    0.9 %  sodium chloride infusion, 500 mL, Intravenous, Once, Mansouraty, Netty Starring., MD  Review of Systems:  Negative unless indicated in HPI.   Physical Exam: Vitals:   11/04/22 0800  BP: 130/88  Pulse: (!) 105  Temp: 97.7 F (36.5 C)  TempSrc: Oral  SpO2: 97%  Weight: (!) 330 lb 3.2 oz (149.8 kg)    Body mass index is 44.78 kg/m.   Physical Exam Musculoskeletal:     Cervical back: Normal.     Thoracic back: Normal.     Lumbar back: Normal.      Impression and Plan:  Acute left-sided low back pain with left-sided sciatica -     Meloxicam; Take 1 tablet (7.5 mg total) by mouth daily.  Dispense: 30 tablet; Refill: 0 -  Cyclobenzaprine HCl; Take 1 tablet (5 mg total) by mouth at bedtime as needed for muscle spasms.  Dispense: 30 tablet; Refill: 1  Immunization due   -Flu vaccine administered in office today. -Suspect this is simply musculoskeletal pain given lack of red flag signs/symptoms. -Advised icing, as needed NSAIDs, back stretches, local massage therapy.  Have prescribed meloxicam and Flexeril to take as needed. -If no improvement in 3 to 4 weeks, can consider referral to physical therapy.   Time spent:30 minutes reviewing chart, interviewing and examining patient and formulating plan of care.     Chaya Jan, MD [facility]

## 2022-11-06 ENCOUNTER — Encounter (HOSPITAL_BASED_OUTPATIENT_CLINIC_OR_DEPARTMENT_OTHER): Payer: 59 | Admitting: Family

## 2022-11-11 ENCOUNTER — Encounter (HOSPITAL_BASED_OUTPATIENT_CLINIC_OR_DEPARTMENT_OTHER): Payer: Self-pay

## 2022-12-01 ENCOUNTER — Other Ambulatory Visit: Payer: Self-pay | Admitting: Internal Medicine

## 2022-12-01 DIAGNOSIS — M5442 Lumbago with sciatica, left side: Secondary | ICD-10-CM

## 2022-12-27 ENCOUNTER — Other Ambulatory Visit: Payer: Self-pay | Admitting: Internal Medicine

## 2023-01-07 ENCOUNTER — Other Ambulatory Visit: Payer: Self-pay | Admitting: Internal Medicine

## 2023-01-07 DIAGNOSIS — M5442 Lumbago with sciatica, left side: Secondary | ICD-10-CM

## 2023-01-08 ENCOUNTER — Other Ambulatory Visit: Payer: Self-pay | Admitting: Internal Medicine

## 2023-01-08 DIAGNOSIS — M5442 Lumbago with sciatica, left side: Secondary | ICD-10-CM

## 2023-02-04 ENCOUNTER — Other Ambulatory Visit: Payer: Self-pay | Admitting: Internal Medicine

## 2023-02-04 DIAGNOSIS — M5442 Lumbago with sciatica, left side: Secondary | ICD-10-CM

## 2023-02-05 ENCOUNTER — Encounter (HOSPITAL_BASED_OUTPATIENT_CLINIC_OR_DEPARTMENT_OTHER): Payer: 59 | Admitting: Family

## 2023-04-09 ENCOUNTER — Ambulatory Visit (INDEPENDENT_AMBULATORY_CARE_PROVIDER_SITE_OTHER): Payer: 59 | Admitting: Family

## 2023-04-09 ENCOUNTER — Encounter (HOSPITAL_BASED_OUTPATIENT_CLINIC_OR_DEPARTMENT_OTHER): Payer: Self-pay | Admitting: Family

## 2023-04-09 VITALS — BP 150/90 | HR 97 | Ht 72.0 in | Wt 345.0 lb

## 2023-04-09 DIAGNOSIS — Z6841 Body Mass Index (BMI) 40.0 and over, adult: Secondary | ICD-10-CM | POA: Diagnosis not present

## 2023-04-09 DIAGNOSIS — I701 Atherosclerosis of renal artery: Secondary | ICD-10-CM

## 2023-04-09 DIAGNOSIS — Z0000 Encounter for general adult medical examination without abnormal findings: Secondary | ICD-10-CM

## 2023-04-09 DIAGNOSIS — I10 Essential (primary) hypertension: Secondary | ICD-10-CM | POA: Diagnosis not present

## 2023-04-09 NOTE — Patient Instructions (Signed)
Medication Instructions:  Your physician recommends that you continue on your current medications as directed. Please refer to the Current Medication list given to you today.    Labwork: Your physician recommends that you return for lab work today- Lipid Panel, CBC, CMP, A1c, and TSH     Follow-Up: 3-4 months in ADV HTN CLINIC with Dr. Duke Salvia or Gillian Shields, NP

## 2023-04-09 NOTE — Progress Notes (Signed)
Advanced Hypertension Clinic Assessment:    Date:  04/09/2023   ID:  Mike [name], DOB May 12, 1970, MRN [id number]  PCP:  Mike [name], Mike Patricia, MD  Cardiologist:  None  Nephrologist:  Referring MD: Mike [name], Estel*   CC: Hypertension  History of Present Illness:    Mike [name] is a 53 y.o. male with a hx of hypertension, obesity here to establish care in the Advanced Hypertension Clinic.   Establish with advanced hypertension clinic 03/2022.  He developed hypertension in high school.  Amlodipine, valsartan/hydrochlorothiazide, metoprolol were transition to Tribenzor and carvedilol.  Renal Dopplers 03/2022 concerning for significant stenosis on the right and normal on the left.  However, CTA abdomen no significant stenosis identified.  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  Of note he never started carvedilol as he reported it was not available he wanted to the pharmacy.  Blood pressure has been found to be better controlled at home than in clinic visits.  Last seen 10/10/2022 BP mildly elevated in clinic.  He requested to resume checking at home prior to making any changes.  Presents today for follow-up independently.  Feeling well since last seen. Has recently started back weight lifting. 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 Eating at home and following low salt diet.  Has not been monitoring BP at home.  Weight gain of 8 pounds over the last 6 months which she attributes inactivity. Reports no shortness of breath nor dyspnea on exertion. Reports no chest pain, pressure, or tightness. No  orthopnea, PND.  Mild edema by end of day after standing long hours at work which is overall not bothersome and resolves with elevation.  Reports no palpitations.    Previous antihypertensives: Lisinopril Losartan  Past Medical History:  Diagnosis Date   Gout 03/25/2019   Uric acid 9.0 January 2021.  Starting allopurinol.  Prescribed colchicine for  gout prophylaxis for the first 1-3 months.   Hypertension    Morbid obesity (HCC)    MRSA infection    11 years ago    No past surgical history on file.  Current Medications: Current Meds  Medication Sig   allopurinol (ZYLOPRIM) 300 MG tablet TAKE 1 TABLET (300 MG TOTAL) BY MOUTH DAILY. TO LOWER URIC ACID TO PREVENT GOUT LONG-TERM.   Ginkgo 60 MG TABS Take by mouth.   Olmesartan-amLODIPine-HCTZ 40-10-25 MG TABS Take 1 tablet by mouth daily.   UNABLE TO FIND Take 3 capsules by mouth daily. Med Name: Magnesium-Calcium-Vitamin D     Allergies:   Lisinopril   Social History   Socioeconomic History   Marital status: Married    Spouse name: Not on file   Number of children: Not on file   Years of education: Not on file   Highest education level: 12th grade  Mike History   Not on file  Tobacco Use   Smoking status: Former    Types: Cigarettes   Smokeless tobacco: Never   Tobacco comments:    Greater than 10 years ago  Vaping Use   Vaping status: Never Used  Substance and Sexual Activity   Alcohol use: Yes    Alcohol/week: 21.0 standard drinks of alcohol    Types: 21 Cans of beer per week   Drug use: Not Currently    Comment: greater than 10 years marijuana use   Sexual activity: Yes  Other Topics Concern   Not on file  Social History Narrative   Not on  file   Social Drivers of [name]   Financial Resource Strain: Low Risk  (03/03/2022)   Overall Financial Resource Strain (CARDIA)    Difficulty of Paying Living Expenses: Not hard at all  Food Insecurity: No Food Insecurity (03/03/2022)   Hunger Vital Sign    Worried About Running Out of Food in the Last Year: Never true    Ran Out of Food in the Last Year: Never true  Transportation Needs: No Transportation Needs (03/03/2022)   PRAPARE - Administrator, Civil Service (Medical): No    Lack of Transportation (Non-Medical): No  Physical Activity: Insufficiently Active (03/03/2022)   Exercise Vital Sign     Days of Exercise per Week: 2 days    Minutes of Exercise per Session: 10 min  Stress: No Stress Concern Present (03/03/2022)   Mike [name]    Feeling of Stress : Not at all  Social Connections: Moderately Isolated (03/03/2022)   Social Connection and Isolation Panel [NHANES]    Frequency of Communication with Friends and Family: More than three times a week    Frequency of Social Gatherings with Friends and Family: Never    Attends Religious Services: Never    Diplomatic Services operational officer: No    Attends Engineer, structural: Not on file    Marital Status: Married     Family History: The patient's family history includes Diabetes in his brother, father, and mother; Hypertension in his father, maternal aunt, maternal grandfather, maternal grandmother, maternal uncle, and mother; Lung cancer in his father; Stroke in his father; Thyroid disease in his father. There is no history of Colon cancer, Esophageal cancer, Rectal cancer, or Stomach cancer.  ROS:   Please see the history of present illness.     All other systems reviewed and are negative.  EKGs/Labs/Other Studies Reviewed:    EKG Interpretation Date/Time:  Thursday April 09 2023 08:16:37 EST Ventricular Rate:  97 PR Interval:  208 QRS Duration:  88 QT Interval:  366 QTC Calculation: 464 R Axis:   1  Text Interpretation: Normal sinus rhythm  No acute ST/T wave changes. Confirmed by Mike [name] ([id number]) on 04/09/2023 8:18:35 AM    Recent Labs: 05/22/2022: BUN 15; Creatinine, Ser 0.87; Potassium 3.9; Sodium 140   Recent Lipid Panel    Component Value Date/Time   CHOL 178 03/06/2022 0803   TRIG 75.0 03/06/2022 0803   HDL 68.10 03/06/2022 0803   CHOLHDL 3 03/06/2022 0803   VLDL 15.0 03/06/2022 0803   LDLCALC 95 03/06/2022 0803    Physical Exam:   VS:  BP (!) 150/90   Pulse 97   Ht 6' (1.829 m)   Wt (!) 345 lb (156.5 kg)   SpO2  98%   BMI 46.79 kg/m  , BMI Body mass index is 46.79 kg/m.  Vitals:   04/09/23 0819 04/09/23 0830  BP: (!) 162/109 (!) 150/90  Pulse: 97   Height: 6' (1.829 m)   Weight: (!) 345 lb (156.5 kg)   SpO2: 98%   BMI (Calculated): 46.78     GENERAL:  Well appearing HEENT: Pupils equal round and reactive, fundi not visualized, oral mucosa unremarkable NECK:  No jugular venous distention, waveform within normal limits, carotid upstroke brisk and symmetric, no bruits, no thyromegaly LYMPHATICS:  No cervical adenopathy LUNGS:  Clear to auscultation bilaterally HEART:  RRR.  PMI not displaced or sustained,S1 and S2 within normal limits, no S3,  no S4, no clicks, no rubs, no murmurs ABD:  Flat, positive bowel sounds normal in frequency in pitch, no bruits, no rebound, no guarding, no midline pulsatile mass, no hepatomegaly, no splenomegaly EXT:  2 plus pulses throughout, no edema, no cyanosis no clubbing SKIN:  No rashes no nodules NEURO:  Cranial nerves II through XII grossly intact, motor grossly intact throughout PSYCH:  Cognitively intact, oriented to person place and time   ASSESSMENT/PLAN:    HTN -BP not at goal less than 130/80.  Has not been checking at home.  He request to check at home and continue present regimen olmesartan-amlodipine-HCTZ 40 - 10 - 25 mg daily. Check in via MyChart in 2 weeks.If BP not at goal consider addition of Spironolactone.  Update CMP, TSH today.  LE edema - If stands for whole day will note mild edema but overall bothersome.  Likely element of dependent edema and venous insufficiency.  Continue HCTZ as part of his antihypertensive regimen but no indication for loop diuretic at this time.  Encourage leg elevation, low-sodium diet. . Obesity - Weight loss via diet and exercise encouraged. Discussed the impact being overweight would have on cardiovascular risk.  Recently resumed weight lifting.  Encouraged to add aerobic exercise as well.  We discussed  increasing intensity of exercise so as not to have to spend prolonged hours at the gym with his busy schedule. Update lipid panel, CBC, A1c today for general [name] maintenance and monitoring.   Screening for Secondary Hypertension:     03/31/2022    3:48 PM  Causes  Drugs/Herbals Screened     - Comments limits sodium.  no tobacco.  + EtOH/day.  Renovascular HTN Screened     - Comments check renal artery Dopplers  Sleep Apnea Screened     - Comments no snoring or apnea.  Thyroid Disease Screened  Hyperaldosteronism Screened     - Comments check renin and aldosterone  Pheochromocytoma N/A  Cushing's Syndrome N/A  Hyperparathyroidism Screened  Coarctation of the Aorta Screened     - Comments BP symmetric  Compliance Screened    Relevant Labs/Studies:    Latest Ref Rng & Units 05/22/2022    9:33 AM 03/06/2022    8:03 AM 08/17/2020   10:07 AM  Basic Labs  Sodium 134 - 144 mmol/L 140  139  136   Potassium 3.5 - 5.2 mmol/L 3.9  3.8  4.0   Creatinine 0.76 - 1.27 mg/dL 4.65  0.35  4.65        Latest Ref Rng & Units 08/17/2020   10:07 AM 09/24/2018    8:51 AM  Thyroid   TSH 0.35 - 4.50 uIU/mL 3.58  2.48        Latest Ref Rng & Units 04/29/2022    1:24 PM  Renin/Aldosterone   Aldosterone 0.0 - 30.0 ng/dL 3.3   Aldos/Renin Ratio 0.0 - 30.0 0.1           Latest Ref Rng & Units 07/21/2009    6:00 PM  Cortisol  Cortisol  ug/dL 68.1 (NOTE)  AM:  4.3 - 22.4 ug/dL PM:  3.1 - 27.5 ug/dL        1/70/0174   94:49 AM  Renovascular   Renal Artery [place] Completed Yes       Disposition:    FU with MD/PharmD in3-4 months    Medication Adjustments/Labs and Tests Ordered: Current medicines are reviewed at length with the patient today.  Concerns regarding medicines are outlined  above.  Orders Placed This Encounter  Procedures   CBC   Comprehensive metabolic panel   Lipid panel   Hemoglobin A1c   TSH   EKG 12-Lead   No orders of the defined types were placed in this  encounter.    Signed, Alver Sorrow, NP  04/09/2023 10:47 AM    [facility]

## 2023-04-10 ENCOUNTER — Encounter (HOSPITAL_BASED_OUTPATIENT_CLINIC_OR_DEPARTMENT_OTHER): Payer: Self-pay

## 2023-04-10 LAB — CBC
Hematocrit: 41.1 % (ref 37.5–51.0)
Hemoglobin: 13.9 g/dL (ref 13.0–17.7)
MCH: 30 pg (ref 26.6–33.0)
MCHC: 33.8 g/dL (ref 31.5–35.7)
MCV: 89 fL (ref 79–97)
Platelets: 245 10*3/uL (ref 150–450)
RBC: 4.63 x10E6/uL (ref 4.14–5.80)
RDW: 12.8 % (ref 11.6–15.4)
WBC: 4.8 10*3/uL (ref 3.4–10.8)

## 2023-04-10 LAB — COMPREHENSIVE METABOLIC PANEL
ALT: 33 [IU]/L (ref 0–44)
AST: 30 [IU]/L (ref 0–40)
Albumin: 4.5 g/dL (ref 3.8–4.9)
Alkaline Phosphatase: 74 [IU]/L (ref 44–121)
BUN/Creatinine Ratio: 15 (ref 9–20)
BUN: 13 mg/dL (ref 6–24)
Bilirubin Total: 0.4 mg/dL (ref 0.0–1.2)
CO2: 22 mmol/L (ref 20–29)
Calcium: 9.6 mg/dL (ref 8.7–10.2)
Chloride: 97 mmol/L (ref 96–106)
Creatinine, Ser: 0.85 mg/dL (ref 0.76–1.27)
Globulin, Total: 2.6 g/dL (ref 1.5–4.5)
Glucose: 88 mg/dL (ref 70–99)
Potassium: 4.3 mmol/L (ref 3.5–5.2)
Sodium: 136 mmol/L (ref 134–144)
Total Protein: 7.1 g/dL (ref 6.0–8.5)
eGFR: 105 mL/min/{1.73_m2} (ref >59)

## 2023-04-10 LAB — LIPID PANEL
Chol/HDL Ratio: 2.4 {ratio} (ref 0.0–5.0)
Cholesterol, Total: 183 mg/dL (ref 100–199)
HDL: 75 mg/dL (ref >39)
LDL Chol Calc (NIH): 90 mg/dL (ref 0–99)
Triglycerides: 101 mg/dL (ref 0–149)
VLDL Cholesterol Cal: 18 mg/dL (ref 5–40)

## 2023-04-10 LAB — TSH: TSH: 2.69 u[IU]/mL (ref 0.450–4.500)

## 2023-04-10 LAB — HEMOGLOBIN A1C
Est. average glucose Bld gHb Est-mCnc: 108 mg/dL
Hgb A1c MFr Bld: 5.4 % (ref 4.8–5.6)

## 2023-04-23 ENCOUNTER — Encounter (HOSPITAL_BASED_OUTPATIENT_CLINIC_OR_DEPARTMENT_OTHER): Payer: Self-pay

## 2023-07-01 ENCOUNTER — Other Ambulatory Visit (HOSPITAL_BASED_OUTPATIENT_CLINIC_OR_DEPARTMENT_OTHER): Payer: Self-pay | Admitting: Family

## 2023-07-02 ENCOUNTER — Other Ambulatory Visit: Payer: Self-pay | Admitting: Internal Medicine

## 2023-07-30 ENCOUNTER — Encounter (HOSPITAL_BASED_OUTPATIENT_CLINIC_OR_DEPARTMENT_OTHER): Payer: Self-pay | Admitting: Family

## 2023-07-30 ENCOUNTER — Ambulatory Visit (INDEPENDENT_AMBULATORY_CARE_PROVIDER_SITE_OTHER): Payer: 59 | Admitting: Family

## 2023-07-30 VITALS — BP 128/84 | HR 100 | Ht 72.0 in | Wt 339.9 lb

## 2023-07-30 DIAGNOSIS — I10 Essential (primary) hypertension: Secondary | ICD-10-CM | POA: Diagnosis not present

## 2023-07-30 DIAGNOSIS — Z6841 Body Mass Index (BMI) 40.0 and over, adult: Secondary | ICD-10-CM | POA: Diagnosis not present

## 2023-07-30 NOTE — Patient Instructions (Signed)
 Medication Instructions:  Continue your current medications    Follow-Up: Please follow up in 7 months in ADV HTN CLINIC with Dr. Theodis Fiscal, Neomi Banks, NP or Donivan Furry PharmD    Special Instructions:    Let us  know if your blood pressure is consistently >130/80 or less than <120/70

## 2023-07-30 NOTE — Progress Notes (Signed)
 Advanced Hypertension Clinic Assessment:    Date:  07/30/2023   ID:  Mike [name], DOB April 09, 1970, MRN [id number]  PCP:  Zilphia Hilt, Charyl Coppersmith, MD  Cardiologist:  None  Nephrologist:  Referring MD: Zilphia Hilt, Estel*   CC: Hypertension  History of Present Illness:    Mike [name] is a 53 y.o. male with a hx of hypertension, obesity here to establish care in the Advanced Hypertension Clinic.   Establish with advanced hypertension clinic 03/2022.  He developed hypertension in high school.  Amlodipine , valsartan /hydrochlorothiazide , metoprolol  were transition to Tribenzor and carvedilol .  Renal Dopplers 03/2022 concerning for significant stenosis on the right and normal on the left.  However, CTA abdomen no significant stenosis identified.  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  Of note he never started carvedilol  as he reported it was not available he wanted to the pharmacy.  Blood pressure has been found to be better controlled at home than in clinic visits.  Presents today for follow-up independently.  Feeling well since last seen. Since last seen has increased physical activity as had gotten out of routine after his father's passing. Weight loss of 6 lbs since last clinic visit.  Continues to follow low-sodium, heart healthy diet.  BP at home has been checked intermittently but well-controlled.  LE edema improved.  Previous antihypertensives: Lisinopril  Losartan   Past Medical History:  Diagnosis Date   Gout 03/25/2019   Uric acid 9.0 January 2021.  Starting allopurinol .  Prescribed colchicine  for gout prophylaxis for the first 1-3 months.   Hypertension    Morbid obesity (HCC)    MRSA infection    11 years ago    No past surgical history on file.  Current Medications: Current Meds  Medication Sig   allopurinol  (ZYLOPRIM ) 300 MG tablet TAKE 1 TABLET (300 MG TOTAL) BY MOUTH DAILY. TO LOWER URIC ACID TO PREVENT GOUT LONG-TERM.   Ginkgo 60 MG TABS Take by  mouth.   Olmesartan -amLODIPine -HCTZ 40-10-25 MG TABS TAKE 1 TABLET BY MOUTH EVERY DAY   UNABLE TO FIND Take 3 capsules by mouth daily. Med Name: Magnesium-Calcium-Vitamin D      Allergies:   Lisinopril    Social History   Socioeconomic History   Marital status: Married    Spouse name: Not on file   Number of children: Not on file   Years of education: Not on file   Highest education level: 12th grade  Occupational History   Not on file  Tobacco Use   Smoking status: Former    Types: Cigarettes   Smokeless tobacco: Never   Tobacco comments:    Greater than 10 years ago  Vaping Use   Vaping status: Never Used  Substance and Sexual Activity   Alcohol use: Yes    Alcohol/week: 21.0 standard drinks of alcohol    Types: 21 Cans of beer per week   Drug use: Not Currently    Comment: greater than 10 years marijuana use   Sexual activity: Yes  Other Topics Concern   Not on file  Social History Narrative   Not on file   Social Drivers of Health   Financial Resource Strain: Low Risk  (03/03/2022)   Overall Financial Resource Strain (CARDIA)    Difficulty of Paying Living Expenses: Not hard at all  Food Insecurity: No Food Insecurity (03/03/2022)   Hunger Vital Sign    Worried About Running Out of Food in the Last Year: Never true    Ran Out of Food  in the Last Year: Never true  Transportation Needs: No Transportation Needs (03/03/2022)   PRAPARE - Administrator, Civil Service (Medical): No    Lack of Transportation (Non-Medical): No  Physical Activity: Insufficiently Active (03/03/2022)   Exercise Vital Sign    Days of Exercise per Week: 2 days    Minutes of Exercise per Session: 10 min  Stress: No Stress Concern Present (03/03/2022)   [employer] of Occupational Health - Occupational Stress Questionnaire    Feeling of Stress : Not at all  Social Connections: Moderately Isolated (03/03/2022)   Social Connection and Isolation Panel [NHANES]    Frequency of  Communication with Friends and Family: More than three times a week    Frequency of Social Gatherings with Friends and Family: Never    Attends Religious Services: Never    Diplomatic Services operational officer: No    Attends Engineer, structural: Not on file    Marital Status: Married     Family History: The patient's family history includes Diabetes in his brother, father, and mother; Hypertension in his father, maternal aunt, maternal grandfather, maternal grandmother, maternal uncle, and mother; Lung cancer in his father; Stroke in his father; Thyroid  disease in his father. There is no history of Colon cancer, Esophageal cancer, Rectal cancer, or Stomach cancer.  ROS:   Please see the history of present illness.     All other systems reviewed and are negative.  EKGs/Labs/Other Studies Reviewed:         Recent Labs: 04/09/2023: ALT 33; BUN 13; Creatinine, Ser 0.85; Hemoglobin 13.9; Platelets 245; Potassium 4.3; Sodium 136; TSH 2.690   Recent Lipid Panel    Component Value Date/Time   CHOL 183 04/09/2023 0854   TRIG 101 04/09/2023 0854   HDL 75 04/09/2023 0854   CHOLHDL 2.4 04/09/2023 0854   CHOLHDL 3 03/06/2022 0803   VLDL 15.0 03/06/2022 0803   LDLCALC 90 04/09/2023 0854    Physical Exam:   VS:  BP 128/84 (BP Location: Left Arm, Patient Position: Sitting, Cuff Size: Large)   Pulse 100   Ht 6' (1.829 m)   Wt (!) 339 lb 14.4 oz (154.2 kg)   SpO2 98%   BMI 46.10 kg/m  , BMI Body mass index is 46.1 kg/m.  Vitals:   07/30/23 0839  BP: 128/84  Pulse: 100  Height: 6' (1.829 m)  Weight: (!) 339 lb 14.4 oz (154.2 kg)  SpO2: 98%  BMI (Calculated): 46.09    GENERAL:  Well appearing HEENT: Pupils equal round and reactive, fundi not visualized, oral mucosa unremarkable NECK:  No jugular venous distention, waveform within normal limits, carotid upstroke brisk and symmetric, no bruits, no thyromegaly LYMPHATICS:  No cervical adenopathy LUNGS:  Clear to  auscultation bilaterally HEART:  RRR.  PMI not displaced or sustained,S1 and S2 within normal limits, no S3, no S4, no clicks, no rubs, no murmurs ABD:  Flat, positive bowel sounds normal in frequency in pitch, no bruits, no rebound, no guarding, no midline pulsatile mass, no hepatomegaly, no splenomegaly EXT:  2 plus pulses throughout, no edema, no cyanosis no clubbing SKIN:  No rashes no nodules NEURO:  Cranial nerves II through XII grossly intact, motor grossly intact throughout PSYCH:  Cognitively intact, oriented to person place and time   ASSESSMENT/PLAN:    HTN -BP at goal. Continue present regimen olmesartan -amlodipine -HCTZ 40 - 10 - 25 mg daily. Discussed to monitor BP at home at least 2 hours  after medications and sitting for 5-10 minutes.  . Obesity - Weight loss via diet and exercise encouraged. Discussed the impact being overweight would have on cardiovascular risk.  Congratulated on 6 lbs weight loss.   Screening for Secondary Hypertension:     03/31/2022    3:48 PM  Causes  Drugs/Herbals Screened     - Comments limits sodium.  no tobacco.  + EtOH/day.  Renovascular HTN Screened     - Comments check renal artery Dopplers  Sleep Apnea Screened     - Comments no snoring or apnea.  Thyroid  Disease Screened  Hyperaldosteronism Screened     - Comments check renin and aldosterone  Pheochromocytoma N/A  Cushing's Syndrome N/A  Hyperparathyroidism Screened  Coarctation of the Aorta Screened     - Comments BP symmetric  Compliance Screened    Relevant Labs/Studies:    Latest Ref Rng & Units 04/09/2023    8:54 AM 05/22/2022    9:33 AM 03/06/2022    8:03 AM  Basic Labs  Sodium 134 - 144 mmol/L 136  140  139   Potassium 3.5 - 5.2 mmol/L 4.3  3.9  3.8   Creatinine 0.76 - 1.27 mg/dL 1.61  0.96  0.45        Latest Ref Rng & Units 04/09/2023    8:54 AM 08/17/2020   10:07 AM  Thyroid    TSH 0.450 - 4.500 uIU/mL 2.690  3.58        Latest Ref Rng & Units 04/29/2022    1:24 PM   Renin/Aldosterone   Aldosterone 0.0 - 30.0 ng/dL 3.3   Aldos/Renin Ratio 0.0 - 30.0 0.1           Latest Ref Rng & Units 07/21/2009    6:00 PM  Cortisol  Cortisol  ug/dL 40.9 (NOTE)  AM:  4.3 - 22.4 ug/dL PM:  3.1 - 81.1 ug/dL        11/08/7827   56:21 AM  Renovascular   Renal Artery US  Completed Yes       Disposition:    FU with MD/PharmD in 7 months    Medication Adjustments/Labs and Tests Ordered: Current medicines are reviewed at length with the patient today.  Concerns regarding medicines are outlined above.  No orders of the defined types were placed in this encounter.  No orders of the defined types were placed in this encounter.    Signed, Clearnce Curia, NP  07/30/2023 8:56 AM    [facility]

## 2023-12-26 ENCOUNTER — Other Ambulatory Visit (HOSPITAL_BASED_OUTPATIENT_CLINIC_OR_DEPARTMENT_OTHER): Payer: Self-pay | Admitting: Family

## 2023-12-26 ENCOUNTER — Other Ambulatory Visit: Payer: Self-pay | Admitting: Internal Medicine

## 2024-03-31 ENCOUNTER — Other Ambulatory Visit: Payer: Self-pay | Admitting: Internal Medicine
